# Patient Record
Sex: Male | Born: 1950 | Race: Black or African American | Hispanic: No | Marital: Married | Smoking: Former smoker
Health system: Southern US, Community
[De-identification: ages and names within clinical notes are randomized; demographics above are authoritative.]

## PROBLEM LIST (undated history)

## (undated) DIAGNOSIS — M069 Rheumatoid arthritis, unspecified: Secondary | ICD-10-CM

## (undated) DIAGNOSIS — E785 Hyperlipidemia, unspecified: Secondary | ICD-10-CM

## (undated) DIAGNOSIS — K219 Gastro-esophageal reflux disease without esophagitis: Secondary | ICD-10-CM

## (undated) DIAGNOSIS — R0602 Shortness of breath: Secondary | ICD-10-CM

## (undated) DIAGNOSIS — M1990 Unspecified osteoarthritis, unspecified site: Secondary | ICD-10-CM

## (undated) DIAGNOSIS — G4730 Sleep apnea, unspecified: Secondary | ICD-10-CM

## (undated) DIAGNOSIS — E119 Type 2 diabetes mellitus without complications: Secondary | ICD-10-CM

## (undated) DIAGNOSIS — I10 Essential (primary) hypertension: Secondary | ICD-10-CM

## (undated) DIAGNOSIS — N201 Calculus of ureter: Secondary | ICD-10-CM

## (undated) DIAGNOSIS — M2550 Pain in unspecified joint: Secondary | ICD-10-CM

## (undated) DIAGNOSIS — M549 Dorsalgia, unspecified: Secondary | ICD-10-CM

## (undated) HISTORY — DX: Rheumatoid arthritis, unspecified: M06.9

## (undated) HISTORY — DX: Pain in unspecified joint: M25.50

## (undated) HISTORY — DX: Sleep apnea, unspecified: G47.30

## (undated) HISTORY — PX: OTHER SURGICAL HISTORY: SHX169

## (undated) HISTORY — DX: Hyperlipidemia, unspecified: E78.5

## (undated) HISTORY — PX: KNEE ARTHROSCOPY: SUR90

## (undated) HISTORY — DX: Shortness of breath: R06.02

## (undated) HISTORY — DX: Dorsalgia, unspecified: M54.9

## (undated) HISTORY — PX: ANAL FISSURE REPAIR: SHX2312

---

## 2002-03-01 ENCOUNTER — Encounter: Payer: Self-pay | Admitting: General Surgery

## 2002-03-07 ENCOUNTER — Ambulatory Visit (HOSPITAL_COMMUNITY): Admission: RE | Admit: 2002-03-07 | Discharge: 2002-03-07 | Payer: Self-pay | Admitting: General Surgery

## 2002-03-07 ENCOUNTER — Encounter (INDEPENDENT_AMBULATORY_CARE_PROVIDER_SITE_OTHER): Payer: Self-pay | Admitting: Specialist

## 2002-03-16 ENCOUNTER — Emergency Department (HOSPITAL_COMMUNITY): Admission: EM | Admit: 2002-03-16 | Discharge: 2002-03-16 | Payer: Self-pay | Admitting: Emergency Medicine

## 2005-11-30 ENCOUNTER — Encounter: Admission: RE | Admit: 2005-11-30 | Discharge: 2005-11-30 | Payer: Self-pay | Admitting: Orthopedic Surgery

## 2008-04-07 ENCOUNTER — Encounter (INDEPENDENT_AMBULATORY_CARE_PROVIDER_SITE_OTHER): Payer: Self-pay | Admitting: *Deleted

## 2008-04-07 ENCOUNTER — Ambulatory Visit (HOSPITAL_COMMUNITY): Admission: RE | Admit: 2008-04-07 | Discharge: 2008-04-07 | Payer: Self-pay | Admitting: *Deleted

## 2008-05-13 ENCOUNTER — Encounter: Admission: RE | Admit: 2008-05-13 | Discharge: 2008-05-13 | Payer: Self-pay | Admitting: Internal Medicine

## 2009-05-12 IMAGING — CT CT ABDOMEN W/O CM
2 of 3 series · 13 of 32 positions shown, 18 images · non-contrast
Comparison: None

CT ABDOMEN

CLINICAL DATA: Right flank pain for 3 days.  History of urinary
tract calculi, diarrhea and constipation.

CT ABDOMEN AND PELVIS WITHOUT CONTRAST
TECHNIQUE: Multidetector CT imaging of the abdomen and pelvis was
performed following the standard protocol without intravenous
contrast.

[Series 2: renal stone w/o · axial · non-contrast · 0.98mm/px · z∈[-341,-61]mm · 5 of 85 slices shown, 10 images]
[im 15/85  soft-tissue]
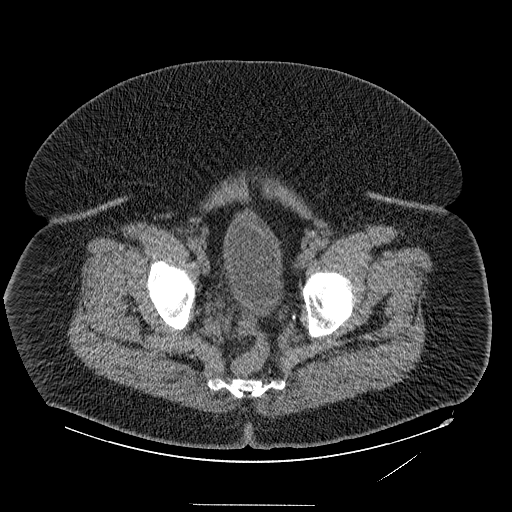
[im 15/85  bone]
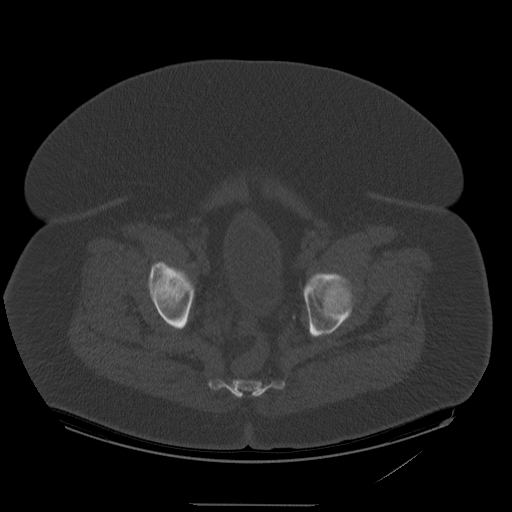
[im 29/85  soft-tissue]
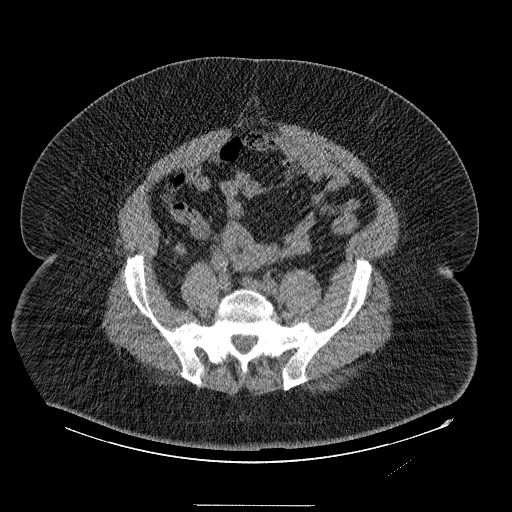
[im 29/85  lung]
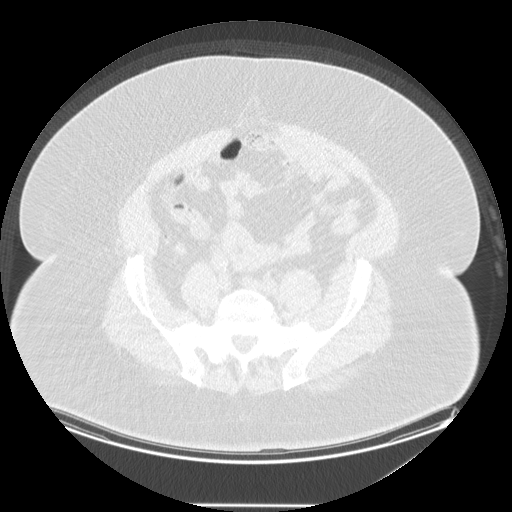
[im 43/85  soft-tissue]
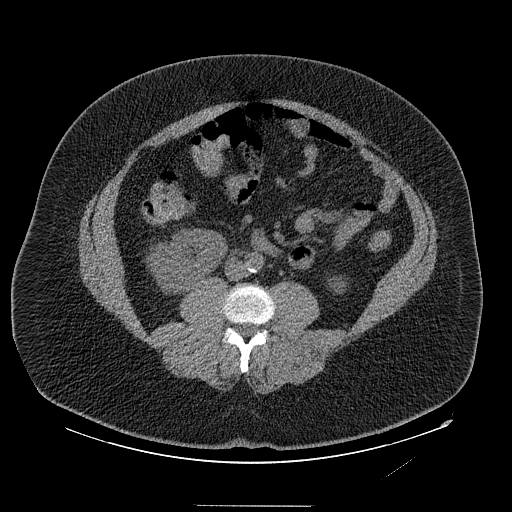
[im 43/85  lung]
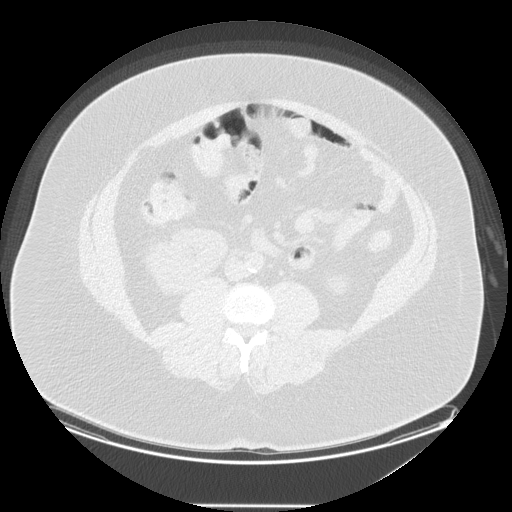
[im 57/85  soft-tissue]
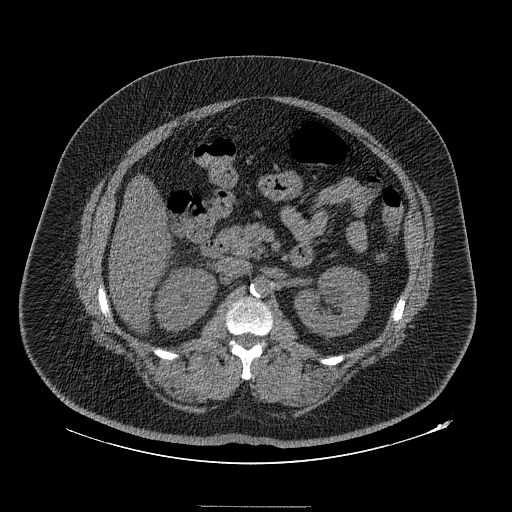
[im 57/85  lung]
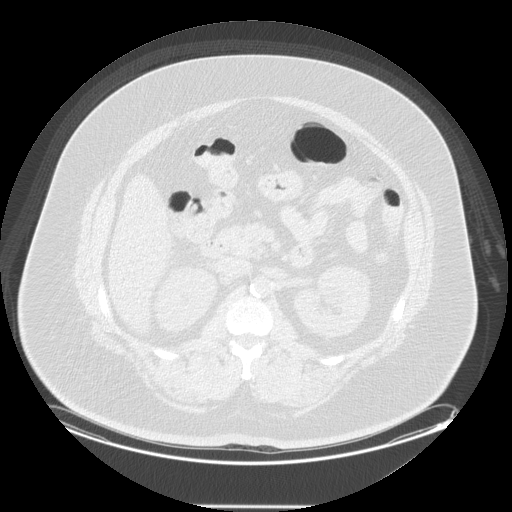
[im 71/85  soft-tissue]
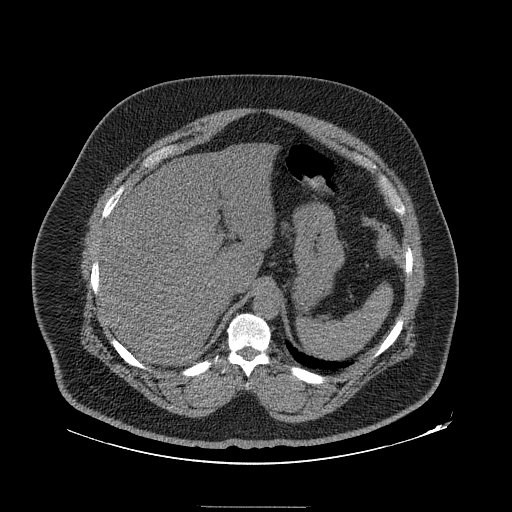
[im 71/85  lung]
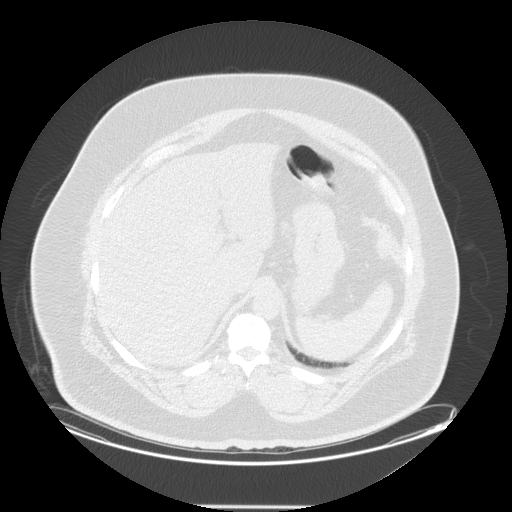

[Series 401: sagittal · sagittal · 0.98mm/px · 8 of 189 slices shown]
[im 15/189  soft-tissue]
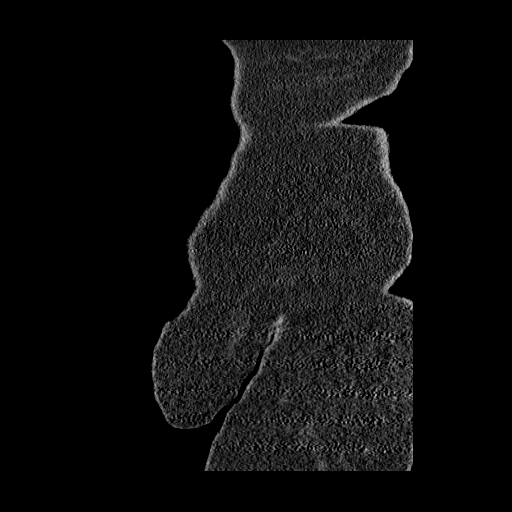
[im 44/189  soft-tissue]
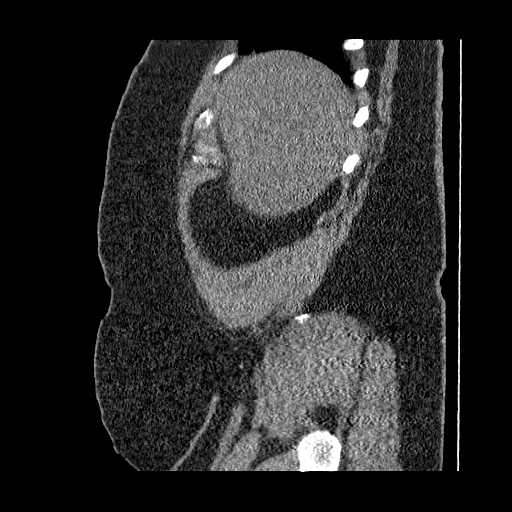
[im 58/189  soft-tissue]
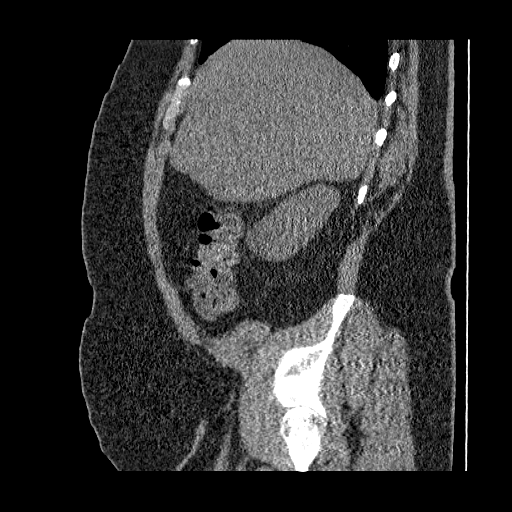
[im 87/189  soft-tissue]
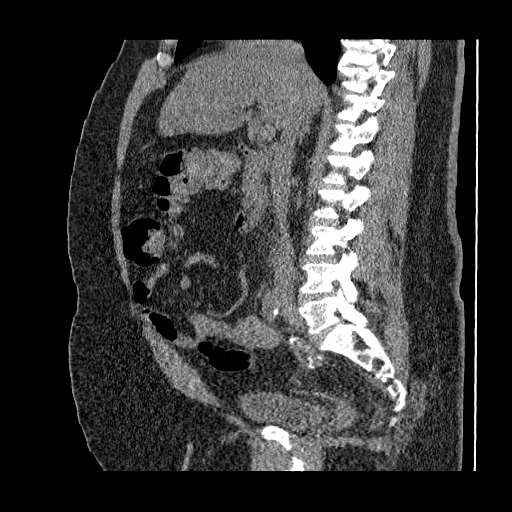
[im 102/189  soft-tissue]
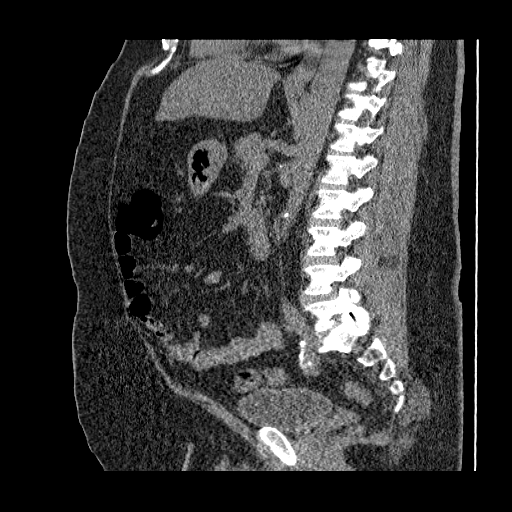
[im 131/189  soft-tissue]
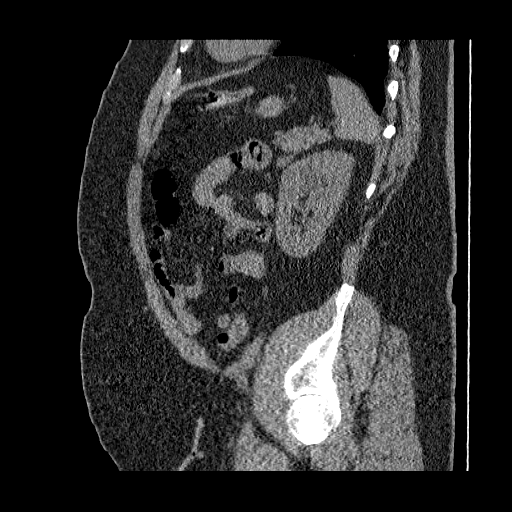
[im 145/189  soft-tissue]
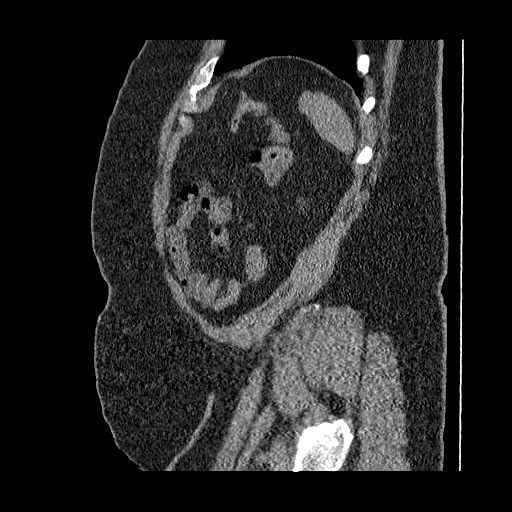
[im 174/189  soft-tissue]
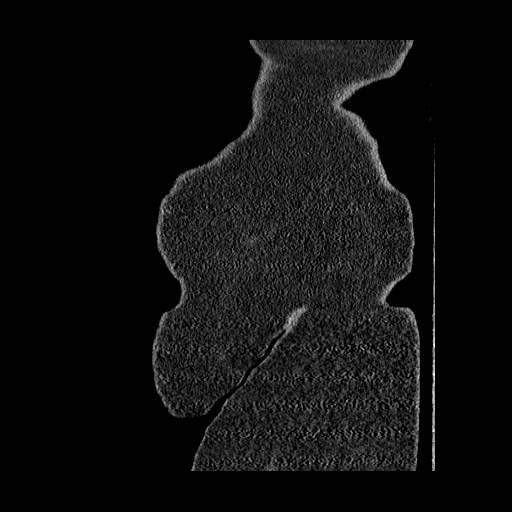

[13 of 32 positions shown; findings below may reference images not displayed]

FINDINGS: The study is limited by body habitus.  The lung bases
are clear.  There is asymmetric perinephric and periureteral soft
tissue stranding on the right associated with mild collecting
system dilatation.  No intra-abdominal ureteral or renal calculi
are demonstrated.

There are probable low density renal lesions bilaterally, including
a 3.1 x 2.8 cm lesion projecting laterally from the lower pole of
the right kidney on image 40 and a 1.7 cm lesion in the upper pole
of the left kidney on image 26. These measure near water
attenuation and are probably cysts, although are suboptimally
evaluated.

The remainder of the visualized abdomen appears unremarkable.
IMPRESSION: 1.  Distal right ureteral obstruction secondary to a calculus
described in the pelvic findings below. No other urinary tract
calculi are identified.
2.  Bilateral low-density renal lesions, likely cysts.  These are
suboptimally evaluated.

CT PELVIS
FINDINGS: The right ureter is dilated into the pelvis where there
is a small obstructing calculus at the ureteral vesicle junction.
This is most obvious on the coronally reformatted images and
measures 2-3 mm in diameter.  No other urinary tract calculi are
identified.  There are bilateral phleboliths.  No pelvic
inflammatory changes are seen.  The appendix appears normal.
IMPRESSION: Obstructing 2-3 mm calculus at the right ureteral vesicle junction.

## 2010-11-16 NOTE — Op Note (Signed)
NAMEJOVANNE, Lance Gregory NO.:  [id number]   MEDICAL RECORD NO.:  [id number]          PATIENT TYPE:  AMB   LOCATION:  ENDO                         FACILITY:  [facility]   PHYSICIAN:  Waverly Ferrari, M.D.    DATE OF BIRTH:  12/10/1950   DATE OF PROCEDURE:  04/07/2008  DATE OF DISCHARGE:                               OPERATIVE REPORT   PROCEDURE:  Colonoscopy.   INDICATIONS:  Anemia.   ANESTHESIA:  50 mcg of fentanyl, Versed 4 mg.   PROCEDURE:  With the patient mildly sedated in the left lateral  decubitus position, the Pentax videoscopic colonoscope was inserted in  the rectum after a rectal exam was attempted and passed under direct  vision to the cecum identified by ileocecal valve and appendiceal  orifice both of which were photographed.  From this point the  colonoscope was slowly withdrawn taking circumferential views of colonic  mucosa, stopping in the distal colon approximately 30 cm from the anal  verge at which point three polyps were seen, photographed and removed  using hot biopsy forceps technique, setting of 20/150 blended current.  When we had withdrawn all the way to the rectum, we placed the endoscope  in retroflexed view to view the anal canal from above, which showed  internal hemorrhoids.  The endoscope was straightened and withdrawn.  The patient's vital signs, pulse oximeter remained stable.  The patient  tolerated procedure well without apparent complication.   FINDINGS:  Small polyps of the distal colon removed.  Internal  hemorrhoids.  Await biopsy report.  The patient will call me for results  and follow-up with me as an outpatient.           ______________________________  Waverly Ferrari, M.D.     GMO/MEDQ  D:  04/07/2008  T:  04/08/2008  Job:  [id number]

## 2010-11-16 NOTE — Op Note (Signed)
NAMEBENARD, Lance NO.:  [id number]   MEDICAL RECORD NO.:  [id number]          PATIENT TYPE:  AMB   LOCATION:  ENDO                         FACILITY:  [facility]   PHYSICIAN:  Waverly Ferrari, M.D.    DATE OF BIRTH:  12-01-1950   DATE OF PROCEDURE:  04/07/2008  DATE OF DISCHARGE:                               OPERATIVE REPORT   PROCEDURE:  Upper endoscopy.   INDICATIONS:  Hemoccult positivity, iron-deficiency anemia.   ANESTHESIA:  Fentanyl 50 mcg, Versed 4 mg.   PROCEDURE:  With the patient mildly sedated in the left lateral  decubitus position, the Pentax videoscopic endoscope was inserted in the  mouth, passed under direct vision through the esophagus which appeared  normal until we reached distal esophagus and there was changes of  esophagitis with linear erythema extending cephalad.  This was  photographed and subsequently biopsied.  We entered into the stomach.  The fundus, body, antrum, duodenal bulb, and second portion duodenum  were visualized.  From this point the endoscope was slowly withdrawn  taking circumferential views of duodenal mucosa until the endoscope had  been pulled back into the stomach, placed in retroflexion to view the  stomach from below.  The endoscope was straightened and withdrawn taking  circumferential views of the remaining gastric and esophageal mucosa,  stopping in the body and fundus of the stomach where diffuse erythema  and snake skinning was noted, photographed and biopsied.  The endoscope  was withdrawn.  The patient's vital signs and pulse oximeter remained  stable.  The patient tolerated procedure well without apparent  complications.   FINDINGS:  Erythema and snake skinning of the body and fundus of the  stomach, biopsied and a linear erythema in the esophagus indicating  esophagitis biopsied.  Await biopsy report.  The patient will call me  for results and follow-up with me as an outpatient.  Proceed to  colonoscopy  as planned.           ______________________________  Waverly Ferrari, M.D.     GMO/MEDQ  D:  04/07/2008  T:  04/08/2008  Job:  [id number]
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
## 2011-04-04 LAB — GLUCOSE, CAPILLARY: Glucose-Capillary: 157 — ABNORMAL HIGH

## 2012-05-11 ENCOUNTER — Other Ambulatory Visit: Payer: Self-pay | Admitting: Urology

## 2012-05-15 ENCOUNTER — Encounter (HOSPITAL_COMMUNITY)
Admission: RE | Admit: 2012-05-15 | Discharge: 2012-05-15 | Disposition: A | Discharge status: Home / Self Care | Payer: BC Managed Care – PPO | Source: Ambulatory Visit | Attending: Urology | Admitting: Urology

## 2012-05-15 ENCOUNTER — Ambulatory Visit (HOSPITAL_COMMUNITY)
Admission: RE | Admit: 2012-05-15 | Discharge: 2012-05-15 | Disposition: A | Discharge status: Home / Self Care | Payer: BC Managed Care – PPO | Source: Ambulatory Visit | Attending: Urology | Admitting: Urology

## 2012-05-15 ENCOUNTER — Encounter (HOSPITAL_COMMUNITY): Payer: Self-pay

## 2012-05-15 ENCOUNTER — Encounter (HOSPITAL_COMMUNITY): Payer: Self-pay | Admitting: Pharmacy Technician

## 2012-05-15 DIAGNOSIS — I10 Essential (primary) hypertension: Secondary | ICD-10-CM | POA: Insufficient documentation

## 2012-05-15 HISTORY — DX: Gastro-esophageal reflux disease without esophagitis: K21.9

## 2012-05-15 HISTORY — DX: Unspecified osteoarthritis, unspecified site: M19.90

## 2012-05-15 HISTORY — DX: Essential (primary) hypertension: I10

## 2012-05-15 HISTORY — DX: Calculus of ureter: N20.1

## 2012-05-15 HISTORY — DX: Type 2 diabetes mellitus without complications: E11.9

## 2012-05-15 LAB — BASIC METABOLIC PANEL
BUN: 36 mg/dL — ABNORMAL HIGH (ref 6–23)
CO2: 23 mEq/L (ref 19–32)
Calcium: 10.1 mg/dL (ref 8.4–10.5)
Chloride: 100 mEq/L (ref 96–112)
Creatinine, Ser: 2.12 mg/dL — ABNORMAL HIGH (ref 0.50–1.35)
GFR calc Af Amer: 37 mL/min — ABNORMAL LOW (ref >90)
GFR calc non Af Amer: 32 mL/min — ABNORMAL LOW (ref >90)
Glucose, Bld: 185 mg/dL — ABNORMAL HIGH (ref 70–99)
Potassium: 4.4 mEq/L (ref 3.5–5.1)
Sodium: 136 mEq/L (ref 135–145)

## 2012-05-15 LAB — CBC
HCT: 37.1 % — ABNORMAL LOW (ref 39.0–52.0)
Hemoglobin: 12.4 g/dL — ABNORMAL LOW (ref 13.0–17.0)
MCH: 30.2 pg (ref 26.0–34.0)
MCHC: 33.4 g/dL (ref 30.0–36.0)
MCV: 90.3 fL (ref 78.0–100.0)
Platelets: 212 10*3/uL (ref 150–400)
RBC: 4.11 MIL/uL — ABNORMAL LOW (ref 4.22–5.81)
RDW: 13.3 % (ref 11.5–15.5)
WBC: 5.1 10*3/uL (ref 4.0–10.5)

## 2012-05-15 LAB — SURGICAL PCR SCREEN
MRSA, PCR: NEGATIVE
Staphylococcus aureus: NEGATIVE

## 2012-05-15 NOTE — Pre-Procedure Instructions (Signed)
CBC, BMET, EKG, CXR WERE DONE TODAY - PREOP- AT [facility] AS PER ANESTHESIOLOGIST'S GUIDELINES.

## 2012-05-15 NOTE — Patient Instructions (Signed)
YOUR SURGERY IS SCHEDULED AT [facility]  ON:  WED  11/13  AT 9:45 AM  REPORT TO [place] [facility] STAY [facility] AT:  7:45 AM      PHONE # FOR SHORT STAY IS [phone]  DO NOT EAT OR DRINK ANYTHING AFTER MIDNIGHT THE NIGHT BEFORE YOUR SURGERY.  YOU MAY BRUSH YOUR TEETH, RINSE OUT YOUR MOUTH--BUT NO WATER, NO FOOD, NO CHEWING GUM, NO MINTS, NO CANDIES, NO CHEWING TOBACCO.  PLEASE TAKE THE FOLLOWING MEDICATIONS THE AM OF YOUR SURGERY WITH A FEW SIPS OF WATER:  AMLODIPINE   IF YOU USE INHALERS--USE YOUR INHALERS THE AM OF YOUR SURGERY AND BRING INHALERS TO [place].    IF YOU ARE DIABETIC:  DO NOT TAKE ANY DIABETIC MEDICATIONS THE AM OF YOUR SURGERY.  IF YOU TAKE INSULIN IN THE EVENINGS--PLEASE ONLY TAKE 1/2 NORMAL EVENING DOSE THE NIGHT BEFORE YOUR SURGERY.  NO INSULIN THE AM OF YOUR SURGERY.  IF YOU HAVE SLEEP APNEA AND USE CPAP OR BIPAP--PLEASE BRING THE MASK AND THE TUBING.  DO NOT BRING YOUR MACHINE.  DO NOT BRING VALUABLES, MONEY, CREDIT CARDS.  DO NOT WEAR JEWELRY, MAKE-UP, NAIL POLISH AND NO METAL PINS OR CLIPS IN YOUR HAIR. CONTACT LENS, DENTURES / PARTIALS, GLASSES SHOULD NOT BE WORN TO SURGERY AND IN MOST CASES-HEARING AIDS WILL NEED TO BE REMOVED.  BRING YOUR GLASSES CASE, ANY EQUIPMENT NEEDED FOR YOUR CONTACT LENS. FOR PATIENTS ADMITTED TO THE HOSPITAL--CHECK OUT TIME THE DAY OF DISCHARGE IS 11:00 AM.  ALL INPATIENT ROOMS ARE PRIVATE - WITH BATHROOM, TELEPHONE, TELEVISION AND WIFI INTERNET.  IF YOU ARE BEING DISCHARGED THE SAME DAY OF YOUR SURGERY--YOU CAN NOT DRIVE YOURSELF HOME--AND SHOULD NOT GO HOME ALONE BY TAXI OR BUS.  NO DRIVING OR OPERATING MACHINERY FOR 24 HOURS FOLLOWING ANESTHESIA / PAIN MEDICATIONS.  PLEASE MAKE ARRANGEMENTS FOR SOMEONE TO BE WITH YOU AT HOME THE FIRST 24 HOURS AFTER SURGERY. RESPONSIBLE DRIVER'S NAME__DOUGLAS BARRETT                                                 PHONE #   [address] THAT YOU WERE GIVEN: MRSA INFORMATION

## 2012-05-15 NOTE — Pre-Procedure Instructions (Signed)
SELITA CALLED FROM DR. Delton Coombes OFFICE--HE IS NOT IN OFFICE TODAY-SHE DID SEND HIM URGENT MESSAGE TO CHECK PT'S ABNORMAL BMET--BUT HE HAS NOT RESPONDED BACK TODAY.  CHART TAKEN TO SHORT STAY CENTER.

## 2012-05-15 NOTE — Pre-Procedure Instructions (Signed)
PT'S PREOP BMET REPORT WAS FAXED TO DR. Delton Coombes OFFICE--HIS BUN 36, CREAT 2.12.  MESSAGE LEFT ON SELITA'S VOICEMAIL ( SURGERY SCHEDULER FOR DR. Jasmine December ) THAT BMET REPORT WAS FAXED-PLEASE LET ME KNOW DR. Jasmine December RECEIVED.

## 2012-05-15 NOTE — Progress Notes (Signed)
05/15/12 1304  OBSTRUCTIVE SLEEP APNEA  Have you ever been diagnosed with sleep apnea through a sleep study? No  Do you snore loudly (loud enough to be heard through closed doors)?  0  Do you often feel tired, fatigued, or sleepy during the daytime? 0  Has anyone observed you stop breathing during your sleep? 0  Do you have, or are you being treated for high blood pressure? 1  BMI more than 35 kg/m2? 1  Age over 62 years old? 1  Neck circumference greater than 40 cm/18 inches? 1  Gender: 1  Obstructive Sleep Apnea Score 5   Score 4 or greater  Results sent to PCP

## 2012-05-16 ENCOUNTER — Ambulatory Visit (HOSPITAL_COMMUNITY)
Admission: RE | Admit: 2012-05-16 | Discharge: 2012-05-16 | Disposition: A | Discharge status: Home / Self Care | Payer: BC Managed Care – PPO | Source: Ambulatory Visit | Attending: Urology | Admitting: Urology

## 2012-05-16 ENCOUNTER — Encounter (HOSPITAL_COMMUNITY)
Admission: RE | Disposition: A | Discharge status: Home / Self Care | Payer: Self-pay | Source: Ambulatory Visit | Attending: Urology

## 2012-05-16 ENCOUNTER — Ambulatory Visit (HOSPITAL_COMMUNITY): Payer: BC Managed Care – PPO | Admitting: Anesthesiology

## 2012-05-16 ENCOUNTER — Encounter (HOSPITAL_COMMUNITY): Payer: Self-pay | Admitting: Anesthesiology

## 2012-05-16 ENCOUNTER — Encounter (HOSPITAL_COMMUNITY): Payer: Self-pay | Admitting: *Deleted

## 2012-05-16 DIAGNOSIS — K219 Gastro-esophageal reflux disease without esophagitis: Secondary | ICD-10-CM | POA: Insufficient documentation

## 2012-05-16 DIAGNOSIS — E119 Type 2 diabetes mellitus without complications: Secondary | ICD-10-CM | POA: Insufficient documentation

## 2012-05-16 DIAGNOSIS — N201 Calculus of ureter: Secondary | ICD-10-CM | POA: Insufficient documentation

## 2012-05-16 DIAGNOSIS — N135 Crossing vessel and stricture of ureter without hydronephrosis: Secondary | ICD-10-CM | POA: Insufficient documentation

## 2012-05-16 DIAGNOSIS — I10 Essential (primary) hypertension: Secondary | ICD-10-CM | POA: Insufficient documentation

## 2012-05-16 DIAGNOSIS — Z79899 Other long term (current) drug therapy: Secondary | ICD-10-CM | POA: Insufficient documentation

## 2012-05-16 HISTORY — PX: HOLMIUM LASER APPLICATION: SHX5852

## 2012-05-16 HISTORY — PX: CYSTOSCOPY WITH URETEROSCOPY: SHX5123

## 2012-05-16 LAB — GLUCOSE, CAPILLARY
Glucose-Capillary: 133 mg/dL — ABNORMAL HIGH (ref 70–99)
Glucose-Capillary: 145 mg/dL — ABNORMAL HIGH (ref 70–99)

## 2012-05-16 SURGERY — HOLMIUM LASER APPLICATION
Anesthesia: General | Site: Ureter | Laterality: Left | Wound class: Clean Contaminated

## 2012-05-16 MED ORDER — PHENAZOPYRIDINE HCL 100 MG PO TABS: 100.0000 mg | ORAL_TABLET | Freq: Three times a day (TID) | ORAL | Status: DC | PRN

## 2012-05-16 MED ORDER — BELLADONNA ALKALOIDS-OPIUM 16.2-60 MG RE SUPP
RECTAL | Status: DC | PRN
  Administered 2012-05-16: 1 via RECTAL

## 2012-05-16 MED ORDER — OXYCODONE-ACETAMINOPHEN 5-325 MG PO TABS: 1.0000 | ORAL_TABLET | ORAL | Status: DC | PRN

## 2012-05-16 MED ORDER — ONDANSETRON HCL 4 MG/2ML IJ SOLN
INTRAMUSCULAR | Status: DC | PRN
  Administered 2012-05-16: 4 mg via INTRAVENOUS

## 2012-05-16 MED ORDER — SUCCINYLCHOLINE CHLORIDE 20 MG/ML IJ SOLN
INTRAMUSCULAR | Status: DC | PRN
  Administered 2012-05-16: 100 mg via INTRAVENOUS

## 2012-05-16 MED ORDER — CEFAZOLIN SODIUM-DEXTROSE 2-3 GM-% IV SOLR
INTRAVENOUS | Status: AC
  Filled 2012-05-16: qty 50

## 2012-05-16 MED ORDER — CEPHALEXIN 500 MG PO CAPS: 500.0000 mg | ORAL_CAPSULE | Freq: Three times a day (TID) | ORAL | Status: DC

## 2012-05-16 MED ORDER — LIDOCAINE HCL (CARDIAC) 20 MG/ML IV SOLN
INTRAVENOUS | Status: DC | PRN
  Administered 2012-05-16: 100 mg via INTRAVENOUS

## 2012-05-16 MED ORDER — NEOSTIGMINE METHYLSULFATE 1 MG/ML IJ SOLN
INTRAMUSCULAR | Status: DC | PRN
  Administered 2012-05-16: 5 mg via INTRAVENOUS

## 2012-05-16 MED ORDER — ROCURONIUM BROMIDE 100 MG/10ML IV SOLN
INTRAVENOUS | Status: DC | PRN
  Administered 2012-05-16: 20 mg via INTRAVENOUS
  Administered 2012-05-16: 10 mg via INTRAVENOUS

## 2012-05-16 MED ORDER — IOHEXOL 300 MG/ML  SOLN
INTRAMUSCULAR | Status: AC
  Filled 2012-05-16: qty 1

## 2012-05-16 MED ORDER — GLYCOPYRROLATE 0.2 MG/ML IJ SOLN
INTRAMUSCULAR | Status: DC | PRN
  Administered 2012-05-16: 0.6 mg via INTRAVENOUS

## 2012-05-16 MED ORDER — MIDAZOLAM HCL 5 MG/5ML IJ SOLN
INTRAMUSCULAR | Status: DC | PRN
  Administered 2012-05-16: 2 mg via INTRAVENOUS

## 2012-05-16 MED ORDER — HYOSCYAMINE SULFATE 0.125 MG PO TABS: 0.1250 mg | ORAL_TABLET | ORAL | Status: DC | PRN

## 2012-05-16 MED ORDER — FENTANYL CITRATE 0.05 MG/ML IJ SOLN
INTRAMUSCULAR | Status: DC | PRN
  Administered 2012-05-16: 100 ug via INTRAVENOUS

## 2012-05-16 MED ORDER — PROMETHAZINE HCL 25 MG/ML IJ SOLN: 6.2500 mg | INTRAMUSCULAR | Status: DC | PRN

## 2012-05-16 MED ORDER — FENTANYL CITRATE 0.05 MG/ML IJ SOLN
25.0000 ug | INTRAMUSCULAR | Status: DC | PRN
  Administered 2012-05-16: 50 ug via INTRAVENOUS

## 2012-05-16 MED ORDER — CARVEDILOL 3.125 MG PO TABS
3.1250 mg | ORAL_TABLET | Freq: Once | ORAL | Status: AC
  Administered 2012-05-16: 3.125 mg via ORAL
  Filled 2012-05-16: qty 1

## 2012-05-16 MED ORDER — CEFAZOLIN SODIUM 1-5 GM-% IV SOLN
INTRAVENOUS | Status: AC
  Filled 2012-05-16: qty 50

## 2012-05-16 MED ORDER — LIDOCAINE HCL 2 % EX GEL
CUTANEOUS | Status: AC
  Filled 2012-05-16: qty 10

## 2012-05-16 MED ORDER — LIDOCAINE HCL 2 % EX GEL
CUTANEOUS | Status: DC | PRN
  Administered 2012-05-16: 1 via URETHRAL

## 2012-05-16 MED ORDER — SENNOSIDES-DOCUSATE SODIUM 8.6-50 MG PO TABS: 1.0000 | ORAL_TABLET | Freq: Two times a day (BID) | ORAL | Status: DC

## 2012-05-16 MED ORDER — DEXAMETHASONE SODIUM PHOSPHATE 10 MG/ML IJ SOLN
INTRAMUSCULAR | Status: DC | PRN
  Administered 2012-05-16: 10 mg via INTRAVENOUS

## 2012-05-16 MED ORDER — LACTATED RINGERS IV SOLN
INTRAVENOUS | Status: DC | PRN
  Administered 2012-05-16 (×2): via INTRAVENOUS

## 2012-05-16 MED ORDER — SODIUM CHLORIDE 0.9 % IR SOLN
Status: DC | PRN
  Administered 2012-05-16: 3000 mL via INTRAVESICAL

## 2012-05-16 MED ORDER — FENTANYL CITRATE 0.05 MG/ML IJ SOLN
INTRAMUSCULAR | Status: AC
  Filled 2012-05-16: qty 2

## 2012-05-16 MED ORDER — PROPOFOL 10 MG/ML IV BOLUS
INTRAVENOUS | Status: DC | PRN
  Administered 2012-05-16: 200 mg via INTRAVENOUS

## 2012-05-16 MED ORDER — BELLADONNA ALKALOIDS-OPIUM 16.2-60 MG RE SUPP
RECTAL | Status: AC
  Filled 2012-05-16: qty 1

## 2012-05-16 MED ORDER — DEXTROSE 5 % IV SOLN
3.0000 g | INTRAVENOUS | Status: AC
  Administered 2012-05-16: 3 g via INTRAVENOUS
  Filled 2012-05-16: qty 3000

## 2012-05-16 SURGICAL SUPPLY — 41 items
ADAPTER CATH URET PLST 4-6FR (CATHETERS) IMPLANT
BAG URO CATCHER STRL LF (DRAPE) ×9 IMPLANT
BASKET LASER NITINOL 1.9FR (BASKET) IMPLANT
BASKET STNLS GEMINI 4WIRE 3FR (BASKET) IMPLANT
BASKET ZERO TIP NITINOL 2.4FR (BASKET) ×3 IMPLANT
BRUSH URET BIOPSY 3F (UROLOGICAL SUPPLIES) IMPLANT
CANISTER SUCT LVC 12 LTR MEDI- (MISCELLANEOUS) IMPLANT
CATH CLEAR GEL 3F BACKSTOP (CATHETERS) ×3 IMPLANT
CATH INTERMIT  6FR 70CM (CATHETERS) IMPLANT
CATH URET 5FR 28IN CONE TIP (BALLOONS)
CATH URET 5FR 28IN OPEN ENDED (CATHETERS) ×3 IMPLANT
CATH URET 5FR 70CM CONE TIP (BALLOONS) IMPLANT
CATH URET DUAL LUMEN 6-10FR 50 (CATHETERS) IMPLANT
CLOTH BEACON ORANGE TIMEOUT ST (SAFETY) ×3 IMPLANT
DRAPE CAMERA CLOSED 9X96 (DRAPES) ×3 IMPLANT
ELECT REM PT RETURN 9FT ADLT (ELECTROSURGICAL)
ELECTRODE REM PT RTRN 9FT ADLT (ELECTROSURGICAL) IMPLANT
GLOVE BIOGEL M 7.0 STRL (GLOVE) IMPLANT
GLOVE ECLIPSE 7.0 STRL STRAW (GLOVE) ×3 IMPLANT
GLOVE INDICATOR 7.5 STRL GRN (GLOVE) ×3 IMPLANT
GLOVE SURG SS PI 8.0 STRL IVOR (GLOVE) IMPLANT
GOWN PREVENTION PLUS LG XLONG (DISPOSABLE) IMPLANT
GOWN PREVENTION PLUS XLARGE (GOWN DISPOSABLE) ×3 IMPLANT
GOWN STRL REIN XL XLG (GOWN DISPOSABLE) ×3 IMPLANT
GUIDEWIRE 0.038 PTFE COATED (WIRE) IMPLANT
GUIDEWIRE ANG ZIPWIRE 038X150 (WIRE) IMPLANT
GUIDEWIRE STR DUAL SENSOR (WIRE) ×3 IMPLANT
IV NS IRRIG 3000ML ARTHROMATIC (IV SOLUTION) ×6 IMPLANT
KIT BALLIN UROMAX 15FX10 (LABEL) IMPLANT
KIT BALLN UROMAX 15FX4 (MISCELLANEOUS) IMPLANT
KIT BALLN UROMAX 26 75X4 (MISCELLANEOUS)
LASER FIBER DISP (UROLOGICAL SUPPLIES) ×3 IMPLANT
MANIFOLD NEPTUNE II (INSTRUMENTS) ×3 IMPLANT
MARKER SKIN DUAL TIP RULER LAB (MISCELLANEOUS) IMPLANT
PACK CYSTO (CUSTOM PROCEDURE TRAY) ×9 IMPLANT
SET HIGH PRES BAL DIL (LABEL)
SHEATH URET ACCESS 12FR/35CM (UROLOGICAL SUPPLIES) IMPLANT
SHEATH URET ACCESS 12FR/55CM (UROLOGICAL SUPPLIES) IMPLANT
STENT CONTOUR 6FRX26X.038 (STENTS) ×3 IMPLANT
SYRINGE IRR TOOMEY STRL 70CC (SYRINGE) IMPLANT
TUBING CONNECTING 10 (TUBING) ×3 IMPLANT

## 2012-05-16 NOTE — Transfer of Care (Signed)
Immediate Anesthesia Transfer of Care Note  Patient: Lance Gregory  Procedure(s) Performed: Procedure(s) (LRB) with comments: HOLMIUM LASER APPLICATION (Left) CYSTOSCOPY WITH URETEROSCOPY (Left) - left ureteral stent placement  Patient Location: PACU  Anesthesia Type:General  Level of Consciousness: sedated  Airway & Oxygen Therapy: Patient Spontanous Breathing and Patient connected to face mask oxygen  Post-op Assessment: Report given to PACU RN and Post -op Vital signs reviewed and stable  Post vital signs: Reviewed and stable  Complications: No apparent anesthesia complications

## 2012-05-16 NOTE — Op Note (Signed)
Urology Operative Report  Date of Procedure: 05/16/12  Surgeon: Rolan Bucco, MD Assistant: None  Preoperative Diagnosis: Left ureter stone. Postoperative Diagnosis: Left ureter stone. Left ureter stenosis.  Procedure(s): Cystoscopy Left ureteroscopy Laser lithotripsy Basket stone-retrieval Left ureter stent placement with tethering string Fluoroscopy with interpretation less than one hour  Estimated blood loss: None  Specimen: Stones sent to [facility] lab for chemical analysis.  Drains: None  Complications: None  Findings: Left obstructing ureter stone. Left ureter stenosis distal to the stone. No injury to the ureter or perforation.  History of present illness: 61 year old male followed for nephrolithiasis presented with left flank pain. This pain resolved, but I was concerned that he had retained stone. CT confirmed a left distal ureter stone. After reviewing treatment options he elected to proceed with cystoscopy and lithotripsy. He presented today for that procedure.   Procedure in detail: After informed consent was obtained, the patient was taken to the operating room. They were placed in the supine position. SCDs were turned on and in place. IV antibiotics were infused, and general anesthesia was induced. A timeout was performed in which the correct patient, surgical site, and procedure were identified and agreed upon by the team.  The patient was placed in a dorsolithotomy position, making sure to pad all pertinent neurovascular pressure points. The genitals were prepped and draped in the usual sterile fashion.   A rigid cystoscope was advanced through the urethra and into the bladder. The bladder was fully distended and evaluated in a systematic fashion. There were no tumors noted. Attention was turned to the left ureter orifice. It was noted to be stenotic. It was cannulated with a sensor tip wire. Initially, I had difficulty placing the wire around the stone, but with  gentle manipulation, I was able to pass the wire beyond the stone and into the left renal pelvis on fluoroscopy. Next a semirigid ureteroscope was placed into the left ureter with ease after the patient had been paralyzed. I was able to navigate this up to the obstructing stone. It was noted that he had stenosis and possible scar tissue distal to the stone in his ureter. Backstop gel was placed proximal to the stone under fluoroscopy to prevent migration of fragments into the kidney.  Lithotripsy was carried out with a holmium laser and 200  filament. Lithotripsy was carried out at 0.5 J and 20 Hz. After all the fragments were broken down small and removed, they were grasped with a 0 tip Nitinol basket and placed into the bladder. After this, I navigated the ureteroscope beyond the location of the backstop gel and noted no further fragments. I then removed the ureteroscope and evaluated the entire ureter distal to the stone and there was noted to be no perforation or injury other than the previous mentioned scar tissue that was present before starting surgery.  The safety wire was loaded through the rigid cystoscope and a 6 x 26 double-J stent with tethering string someplace was placed up over the wire with ease. His was deployed in the left renal pelvis with a good curl noted on fluoroscopy and a curl noted in the bladder on direct visualization. Urethra lidocaine jelly, 10 cc, were placed into the urethra and a belladonna and opium suppository was placed into the rectum.  This completed the procedure. Anesthesia was reversed and he was put back into a supine position. He was taken to the PACU in stable condition.  He will be given antibiotics to cover for the time  of his ureter stent is in place and he will remove the stent on his own on Monday, 05/21/12.

## 2012-05-16 NOTE — H&P (Signed)
Urology History and Physical Exam  CC: Left ureter stone  HPI: 61 year old male presents today with left ureter stone. This was associated with left-sided flank pain. This is not associated with gross hematuria or dysuria. It is not associated with fevers. KUB 04/26/12 revealed what appeared to be a left distal ureter stone which was 4 mm in size. Renal ultrasound from that same date revealed mild left hydronephrosis. The patient was unsure whether he passed the stone and he was not having any further symptoms. He went on to have a CT scan 05/10/12 to confirm the presence of the stone before proceeding with any type of intervention. This revealed a left distal ureter stone. It was at the level of the iliac bone. It was 8 mm in size. It was associated with mild hydronephrosis. We discussed treatment options including observation, shockwave lithotripsy, and ureteroscopy. He presents today for cystoscopy, left ureteroscopy, laser lithotripsy, possible left ureter stent placement. We have discussed the risks, benefits, alternatives, and likelihood of achieving goals. UA 05/10/12 was negative for signs of infection.   PMH: Past Medical History  Diagnosis Date  . Diabetes mellitus without complication     ORAL MEDS-NO INSULIN  . Hypertension   . Ureteral stone     LEFT  --DISCOMFORT  . GERD (gastroesophageal reflux disease)     OCCAS AFTER EATING CERTAIN FOODS  . Arthritis     PSH: Past Surgical History  Procedure Date  . Knee arthroscopy     SEVERAL  SURGERIES BOTH KNEES  . Vascectomy   . Anal fissure repair     Allergies: Allergies  Allergen Reactions  . Codeine     REACTION WAS YEARS AGO-PT DOES NOT REMEMBER WHAT THE REACTION WAS    Medications: No prescriptions prior to admission     Social History: History   Social History  . Marital Status: Married    Spouse Name: N/A    Number of Children: N/A  . Years of Education: N/A   Occupational History  . Not on file.    Social History Main Topics  . Smoking status: Former Research scientist (life sciences)  . Smokeless tobacco: Never Used     Comment: QUIT SMOKING MANY YRS AGO  . Alcohol Use: Yes     Comment: OCCAS  . Drug Use: No  . Sexually Active:    Other Topics Concern  . Not on file   Social History Narrative  . No narrative on file    Family History: No family history on file.  Review of Systems: Positive: Left flank discomfort. Negative: Fever, chest pain, or SOB.  A further 10 point review of systems was negative except what is listed in the HPI.  Physical Exam: Filed Vitals:   05/16/12 0824  BP: 131/76  Pulse: 70  Temp:   Resp:     General: No acute distress.  Awake. Head:  Normocephalic.  Atraumatic. ENT:  EOMI.  Mucous membranes moist Neck:  Supple.  No lymphadenopathy. Pulmonary: Equal effort bilaterally.  Clear to auscultation bilaterally. Abdomen: Soft.  Non- tender to palpation. Skin:  Normal turgor.  No visible rash. Extremity: No gross deformity of bilateral upper extremities.  No gross deformity of    bilateral lower extremities. Neurologic: Alert. Appropriate mood.    Studies:  Recent Labs  [facility] 05/15/12 1115   HGB 12.4*   WBC 5.1   PLT 212    Recent Labs  [facility] 05/15/12 1115   NA 136   K 4.4   CL  100   CO2 23   BUN 36*   CREATININE 2.12*   CALCIUM 10.1   GFRNONAA 32*   GFRAA 37*     No results found for this basename: PT:2,INR:2,APTT:2 in the last 72 hours   No components found with this basename: ABG:2    Assessment:  Left ureter stone.  Plan: To OR for cystoscopy, left ureteroscopy, laser lithotripsy, possible left ureter stent placement.

## 2012-05-16 NOTE — Anesthesia Preprocedure Evaluation (Addendum)
Anesthesia Evaluation  Patient identified by MRN, date of birth, ID band Patient awake    Reviewed: Allergy & Precautions, H&P , NPO status , Patient's Chart, lab work & pertinent test results  Airway Mallampati: III TM Distance: <3 FB Neck ROM: Full    Dental No notable dental hx. (+) Dental Advisory Given   Pulmonary neg pulmonary ROS,  breath sounds clear to auscultation  + decreased breath sounds      Cardiovascular hypertension, Pt. on medications and Pt. on home beta blockers Rhythm:Regular Rate:Normal     Neuro/Psych negative neurological ROS  negative psych ROS   GI/Hepatic Neg liver ROS, GERD-  ,  Endo/Other  diabetes, Type 2, Oral Hypoglycemic AgentsMorbid obesity  Renal/GU negative Renal ROS  negative genitourinary   Musculoskeletal negative musculoskeletal ROS (+)   Abdominal   Peds  Hematology negative hematology ROS (+)   Anesthesia Other Findings   Reproductive/Obstetrics negative OB ROS                          Anesthesia Physical Anesthesia Plan  ASA: III  Anesthesia Plan: General   Post-op Pain Management:    Induction: Intravenous  Airway Management Planned: LMA  Additional Equipment:   Intra-op Plan:   Post-operative Plan:   Informed Consent: I have reviewed the patients History and Physical, chart, labs and discussed the procedure including the risks, benefits and alternatives for the proposed anesthesia with the patient or authorized representative who has indicated his/her understanding and acceptance.   Dental advisory given  Plan Discussed with: CRNA and Surgeon  Anesthesia Plan Comments:         Anesthesia Quick Evaluation

## 2012-05-16 NOTE — Progress Notes (Signed)
Stent string taped to penis

## 2012-05-16 NOTE — Anesthesia Postprocedure Evaluation (Signed)
  Anesthesia Post-op Note  Patient: Lance Gregory  Procedure(s) Performed: Procedure(s) (LRB): HOLMIUM LASER APPLICATION (Left) CYSTOSCOPY WITH URETEROSCOPY (Left)  Patient Location: PACU  Anesthesia Type: General  Level of Consciousness: awake and alert   Airway and Oxygen Therapy: Patient Spontanous Breathing  Post-op Pain: mild  Post-op Assessment: Post-op Vital signs reviewed, Patient's Cardiovascular Status Stable, Respiratory Function Stable, Patent Airway and No signs of Nausea or vomiting  Post-op Vital Signs: stable  Complications: No apparent anesthesia complications

## 2012-05-17 ENCOUNTER — Encounter (HOSPITAL_COMMUNITY): Payer: Self-pay | Admitting: Urology

## 2014-07-29 DIAGNOSIS — I10 Essential (primary) hypertension: Secondary | ICD-10-CM | POA: Diagnosis not present

## 2014-07-29 DIAGNOSIS — N179 Acute kidney failure, unspecified: Secondary | ICD-10-CM | POA: Diagnosis not present

## 2014-07-29 DIAGNOSIS — N183 Chronic kidney disease, stage 3 (moderate): Secondary | ICD-10-CM | POA: Diagnosis not present

## 2014-07-29 DIAGNOSIS — R809 Proteinuria, unspecified: Secondary | ICD-10-CM | POA: Diagnosis not present

## 2014-10-03 DIAGNOSIS — E119 Type 2 diabetes mellitus without complications: Secondary | ICD-10-CM | POA: Diagnosis not present

## 2014-10-03 DIAGNOSIS — I10 Essential (primary) hypertension: Secondary | ICD-10-CM | POA: Diagnosis not present

## 2014-10-03 DIAGNOSIS — E559 Vitamin D deficiency, unspecified: Secondary | ICD-10-CM | POA: Diagnosis not present

## 2014-10-07 DIAGNOSIS — I10 Essential (primary) hypertension: Secondary | ICD-10-CM | POA: Diagnosis not present

## 2014-10-07 DIAGNOSIS — E780 Pure hypercholesterolemia: Secondary | ICD-10-CM | POA: Diagnosis not present

## 2014-10-07 DIAGNOSIS — E119 Type 2 diabetes mellitus without complications: Secondary | ICD-10-CM | POA: Diagnosis not present

## 2014-10-07 DIAGNOSIS — E559 Vitamin D deficiency, unspecified: Secondary | ICD-10-CM | POA: Diagnosis not present

## 2015-01-01 DIAGNOSIS — E559 Vitamin D deficiency, unspecified: Secondary | ICD-10-CM | POA: Diagnosis not present

## 2015-01-01 DIAGNOSIS — E119 Type 2 diabetes mellitus without complications: Secondary | ICD-10-CM | POA: Diagnosis not present

## 2015-01-01 DIAGNOSIS — I10 Essential (primary) hypertension: Secondary | ICD-10-CM | POA: Diagnosis not present

## 2015-01-08 DIAGNOSIS — E780 Pure hypercholesterolemia: Secondary | ICD-10-CM | POA: Diagnosis not present

## 2015-01-08 DIAGNOSIS — E559 Vitamin D deficiency, unspecified: Secondary | ICD-10-CM | POA: Diagnosis not present

## 2015-01-08 DIAGNOSIS — I10 Essential (primary) hypertension: Secondary | ICD-10-CM | POA: Diagnosis not present

## 2015-01-08 DIAGNOSIS — E119 Type 2 diabetes mellitus without complications: Secondary | ICD-10-CM | POA: Diagnosis not present

## 2015-03-27 DIAGNOSIS — Z23 Encounter for immunization: Secondary | ICD-10-CM | POA: Diagnosis not present

## 2015-04-09 DIAGNOSIS — I10 Essential (primary) hypertension: Secondary | ICD-10-CM | POA: Diagnosis not present

## 2015-04-09 DIAGNOSIS — E119 Type 2 diabetes mellitus without complications: Secondary | ICD-10-CM | POA: Diagnosis not present

## 2015-04-16 DIAGNOSIS — E119 Type 2 diabetes mellitus without complications: Secondary | ICD-10-CM | POA: Diagnosis not present

## 2015-04-16 DIAGNOSIS — E7800 Pure hypercholesterolemia, unspecified: Secondary | ICD-10-CM | POA: Diagnosis not present

## 2015-04-16 DIAGNOSIS — I10 Essential (primary) hypertension: Secondary | ICD-10-CM | POA: Diagnosis not present

## 2015-04-16 DIAGNOSIS — E559 Vitamin D deficiency, unspecified: Secondary | ICD-10-CM | POA: Diagnosis not present

## 2015-04-30 DIAGNOSIS — I10 Essential (primary) hypertension: Secondary | ICD-10-CM | POA: Diagnosis not present

## 2015-04-30 DIAGNOSIS — R809 Proteinuria, unspecified: Secondary | ICD-10-CM | POA: Diagnosis not present

## 2015-04-30 DIAGNOSIS — N179 Acute kidney failure, unspecified: Secondary | ICD-10-CM | POA: Diagnosis not present

## 2015-04-30 DIAGNOSIS — N183 Chronic kidney disease, stage 3 (moderate): Secondary | ICD-10-CM | POA: Diagnosis not present

## 2015-08-13 DIAGNOSIS — E559 Vitamin D deficiency, unspecified: Secondary | ICD-10-CM | POA: Diagnosis not present

## 2015-08-13 DIAGNOSIS — I10 Essential (primary) hypertension: Secondary | ICD-10-CM | POA: Diagnosis not present

## 2015-08-13 DIAGNOSIS — E119 Type 2 diabetes mellitus without complications: Secondary | ICD-10-CM | POA: Diagnosis not present

## 2015-11-19 DIAGNOSIS — H40013 Open angle with borderline findings, low risk, bilateral: Secondary | ICD-10-CM | POA: Diagnosis not present

## 2015-11-19 DIAGNOSIS — Z7984 Long term (current) use of oral hypoglycemic drugs: Secondary | ICD-10-CM | POA: Diagnosis not present

## 2015-11-19 DIAGNOSIS — H2513 Age-related nuclear cataract, bilateral: Secondary | ICD-10-CM | POA: Diagnosis not present

## 2015-11-19 DIAGNOSIS — H5203 Hypermetropia, bilateral: Secondary | ICD-10-CM | POA: Diagnosis not present

## 2015-11-19 DIAGNOSIS — H52223 Regular astigmatism, bilateral: Secondary | ICD-10-CM | POA: Diagnosis not present

## 2015-11-19 DIAGNOSIS — E119 Type 2 diabetes mellitus without complications: Secondary | ICD-10-CM | POA: Diagnosis not present

## 2015-11-19 DIAGNOSIS — H524 Presbyopia: Secondary | ICD-10-CM | POA: Diagnosis not present

## 2015-12-02 DIAGNOSIS — I10 Essential (primary) hypertension: Secondary | ICD-10-CM | POA: Diagnosis not present

## 2015-12-02 DIAGNOSIS — E119 Type 2 diabetes mellitus without complications: Secondary | ICD-10-CM | POA: Diagnosis not present

## 2015-12-02 DIAGNOSIS — E7800 Pure hypercholesterolemia, unspecified: Secondary | ICD-10-CM | POA: Diagnosis not present

## 2015-12-03 DIAGNOSIS — N179 Acute kidney failure, unspecified: Secondary | ICD-10-CM | POA: Diagnosis not present

## 2015-12-03 DIAGNOSIS — R809 Proteinuria, unspecified: Secondary | ICD-10-CM | POA: Diagnosis not present

## 2015-12-03 DIAGNOSIS — I10 Essential (primary) hypertension: Secondary | ICD-10-CM | POA: Diagnosis not present

## 2015-12-03 DIAGNOSIS — N183 Chronic kidney disease, stage 3 (moderate): Secondary | ICD-10-CM | POA: Diagnosis not present

## 2015-12-03 DIAGNOSIS — Z87442 Personal history of urinary calculi: Secondary | ICD-10-CM | POA: Diagnosis not present

## 2015-12-03 DIAGNOSIS — R600 Localized edema: Secondary | ICD-10-CM | POA: Diagnosis not present

## 2015-12-23 DIAGNOSIS — E119 Type 2 diabetes mellitus without complications: Secondary | ICD-10-CM | POA: Diagnosis not present

## 2015-12-23 DIAGNOSIS — I10 Essential (primary) hypertension: Secondary | ICD-10-CM | POA: Diagnosis not present

## 2015-12-30 DIAGNOSIS — E7800 Pure hypercholesterolemia, unspecified: Secondary | ICD-10-CM | POA: Diagnosis not present

## 2015-12-30 DIAGNOSIS — E119 Type 2 diabetes mellitus without complications: Secondary | ICD-10-CM | POA: Diagnosis not present

## 2015-12-30 DIAGNOSIS — I10 Essential (primary) hypertension: Secondary | ICD-10-CM | POA: Diagnosis not present

## 2015-12-30 DIAGNOSIS — E559 Vitamin D deficiency, unspecified: Secondary | ICD-10-CM | POA: Diagnosis not present

## 2016-01-25 DIAGNOSIS — N183 Chronic kidney disease, stage 3 (moderate): Secondary | ICD-10-CM | POA: Diagnosis not present

## 2016-01-28 DIAGNOSIS — R05 Cough: Secondary | ICD-10-CM | POA: Diagnosis not present

## 2016-01-28 DIAGNOSIS — I10 Essential (primary) hypertension: Secondary | ICD-10-CM | POA: Diagnosis not present

## 2016-01-28 DIAGNOSIS — E7800 Pure hypercholesterolemia, unspecified: Secondary | ICD-10-CM | POA: Diagnosis not present

## 2016-01-28 DIAGNOSIS — E119 Type 2 diabetes mellitus without complications: Secondary | ICD-10-CM | POA: Diagnosis not present

## 2016-04-04 DIAGNOSIS — R600 Localized edema: Secondary | ICD-10-CM | POA: Diagnosis not present

## 2016-04-04 DIAGNOSIS — R809 Proteinuria, unspecified: Secondary | ICD-10-CM | POA: Diagnosis not present

## 2016-04-04 DIAGNOSIS — N183 Chronic kidney disease, stage 3 (moderate): Secondary | ICD-10-CM | POA: Diagnosis not present

## 2016-04-04 DIAGNOSIS — N179 Acute kidney failure, unspecified: Secondary | ICD-10-CM | POA: Diagnosis not present

## 2016-04-04 DIAGNOSIS — Z6841 Body Mass Index (BMI) 40.0 and over, adult: Secondary | ICD-10-CM | POA: Diagnosis not present

## 2016-04-04 DIAGNOSIS — Z87442 Personal history of urinary calculi: Secondary | ICD-10-CM | POA: Diagnosis not present

## 2016-04-04 DIAGNOSIS — Z23 Encounter for immunization: Secondary | ICD-10-CM | POA: Diagnosis not present

## 2016-04-04 DIAGNOSIS — I10 Essential (primary) hypertension: Secondary | ICD-10-CM | POA: Diagnosis not present

## 2016-05-24 DIAGNOSIS — E119 Type 2 diabetes mellitus without complications: Secondary | ICD-10-CM | POA: Diagnosis not present

## 2016-05-24 DIAGNOSIS — I10 Essential (primary) hypertension: Secondary | ICD-10-CM | POA: Diagnosis not present

## 2016-05-30 DIAGNOSIS — M25561 Pain in right knee: Secondary | ICD-10-CM | POA: Diagnosis not present

## 2016-05-30 DIAGNOSIS — E119 Type 2 diabetes mellitus without complications: Secondary | ICD-10-CM | POA: Diagnosis not present

## 2016-05-30 DIAGNOSIS — M25562 Pain in left knee: Secondary | ICD-10-CM | POA: Diagnosis not present

## 2016-05-30 DIAGNOSIS — G8929 Other chronic pain: Secondary | ICD-10-CM | POA: Diagnosis not present

## 2016-06-07 DIAGNOSIS — M79672 Pain in left foot: Secondary | ICD-10-CM | POA: Diagnosis not present

## 2016-06-07 DIAGNOSIS — M25569 Pain in unspecified knee: Secondary | ICD-10-CM | POA: Diagnosis not present

## 2016-06-07 DIAGNOSIS — M79643 Pain in unspecified hand: Secondary | ICD-10-CM | POA: Diagnosis not present

## 2016-06-07 DIAGNOSIS — M109 Gout, unspecified: Secondary | ICD-10-CM | POA: Diagnosis not present

## 2016-06-21 DIAGNOSIS — M79672 Pain in left foot: Secondary | ICD-10-CM | POA: Diagnosis not present

## 2016-06-21 DIAGNOSIS — M179 Osteoarthritis of knee, unspecified: Secondary | ICD-10-CM | POA: Diagnosis not present

## 2016-06-21 DIAGNOSIS — M19071 Primary osteoarthritis, right ankle and foot: Secondary | ICD-10-CM | POA: Diagnosis not present

## 2016-06-21 DIAGNOSIS — M549 Dorsalgia, unspecified: Secondary | ICD-10-CM | POA: Diagnosis not present

## 2016-06-21 DIAGNOSIS — M19072 Primary osteoarthritis, left ankle and foot: Secondary | ICD-10-CM | POA: Diagnosis not present

## 2016-06-21 DIAGNOSIS — M19042 Primary osteoarthritis, left hand: Secondary | ICD-10-CM | POA: Diagnosis not present

## 2016-06-21 DIAGNOSIS — M150 Primary generalized (osteo)arthritis: Secondary | ICD-10-CM | POA: Diagnosis not present

## 2016-06-21 DIAGNOSIS — M79641 Pain in right hand: Secondary | ICD-10-CM | POA: Diagnosis not present

## 2016-06-21 DIAGNOSIS — M545 Low back pain: Secondary | ICD-10-CM | POA: Diagnosis not present

## 2016-06-21 DIAGNOSIS — M19041 Primary osteoarthritis, right hand: Secondary | ICD-10-CM | POA: Diagnosis not present

## 2016-06-21 DIAGNOSIS — M79643 Pain in unspecified hand: Secondary | ICD-10-CM | POA: Diagnosis not present

## 2016-06-21 DIAGNOSIS — M79642 Pain in left hand: Secondary | ICD-10-CM | POA: Diagnosis not present

## 2016-06-21 DIAGNOSIS — M79673 Pain in unspecified foot: Secondary | ICD-10-CM | POA: Diagnosis not present

## 2016-06-21 DIAGNOSIS — M109 Gout, unspecified: Secondary | ICD-10-CM | POA: Diagnosis not present

## 2016-06-21 DIAGNOSIS — M25569 Pain in unspecified knee: Secondary | ICD-10-CM | POA: Diagnosis not present

## 2016-07-14 DIAGNOSIS — M25562 Pain in left knee: Secondary | ICD-10-CM | POA: Diagnosis not present

## 2016-07-14 DIAGNOSIS — M109 Gout, unspecified: Secondary | ICD-10-CM | POA: Diagnosis not present

## 2016-07-14 DIAGNOSIS — M150 Primary generalized (osteo)arthritis: Secondary | ICD-10-CM | POA: Diagnosis not present

## 2016-07-14 DIAGNOSIS — M549 Dorsalgia, unspecified: Secondary | ICD-10-CM | POA: Diagnosis not present

## 2016-07-14 DIAGNOSIS — M25569 Pain in unspecified knee: Secondary | ICD-10-CM | POA: Diagnosis not present

## 2016-08-04 DIAGNOSIS — M25569 Pain in unspecified knee: Secondary | ICD-10-CM | POA: Diagnosis not present

## 2016-08-04 DIAGNOSIS — M170 Bilateral primary osteoarthritis of knee: Secondary | ICD-10-CM | POA: Diagnosis not present

## 2016-08-04 DIAGNOSIS — N183 Chronic kidney disease, stage 3 (moderate): Secondary | ICD-10-CM | POA: Diagnosis not present

## 2016-08-04 DIAGNOSIS — M109 Gout, unspecified: Secondary | ICD-10-CM | POA: Diagnosis not present

## 2016-08-04 DIAGNOSIS — M150 Primary generalized (osteo)arthritis: Secondary | ICD-10-CM | POA: Diagnosis not present

## 2016-08-23 DIAGNOSIS — I10 Essential (primary) hypertension: Secondary | ICD-10-CM | POA: Diagnosis not present

## 2016-08-23 DIAGNOSIS — E119 Type 2 diabetes mellitus without complications: Secondary | ICD-10-CM | POA: Diagnosis not present

## 2016-08-30 DIAGNOSIS — I10 Essential (primary) hypertension: Secondary | ICD-10-CM | POA: Diagnosis not present

## 2016-08-30 DIAGNOSIS — E119 Type 2 diabetes mellitus without complications: Secondary | ICD-10-CM | POA: Diagnosis not present

## 2016-08-30 DIAGNOSIS — E7800 Pure hypercholesterolemia, unspecified: Secondary | ICD-10-CM | POA: Diagnosis not present

## 2016-08-30 DIAGNOSIS — E559 Vitamin D deficiency, unspecified: Secondary | ICD-10-CM | POA: Diagnosis not present

## 2016-09-01 DIAGNOSIS — Z23 Encounter for immunization: Secondary | ICD-10-CM | POA: Diagnosis not present

## 2016-09-01 DIAGNOSIS — M170 Bilateral primary osteoarthritis of knee: Secondary | ICD-10-CM | POA: Diagnosis not present

## 2016-09-01 DIAGNOSIS — M109 Gout, unspecified: Secondary | ICD-10-CM | POA: Diagnosis not present

## 2016-09-01 DIAGNOSIS — N183 Chronic kidney disease, stage 3 (moderate): Secondary | ICD-10-CM | POA: Diagnosis not present

## 2016-09-01 DIAGNOSIS — M150 Primary generalized (osteo)arthritis: Secondary | ICD-10-CM | POA: Diagnosis not present

## 2016-10-31 DIAGNOSIS — Z87442 Personal history of urinary calculi: Secondary | ICD-10-CM | POA: Diagnosis not present

## 2016-10-31 DIAGNOSIS — R809 Proteinuria, unspecified: Secondary | ICD-10-CM | POA: Diagnosis not present

## 2016-10-31 DIAGNOSIS — I10 Essential (primary) hypertension: Secondary | ICD-10-CM | POA: Diagnosis not present

## 2016-10-31 DIAGNOSIS — N179 Acute kidney failure, unspecified: Secondary | ICD-10-CM | POA: Diagnosis not present

## 2016-10-31 DIAGNOSIS — R600 Localized edema: Secondary | ICD-10-CM | POA: Diagnosis not present

## 2016-10-31 DIAGNOSIS — Z6841 Body Mass Index (BMI) 40.0 and over, adult: Secondary | ICD-10-CM | POA: Diagnosis not present

## 2016-10-31 DIAGNOSIS — N183 Chronic kidney disease, stage 3 (moderate): Secondary | ICD-10-CM | POA: Diagnosis not present

## 2016-11-24 DIAGNOSIS — E119 Type 2 diabetes mellitus without complications: Secondary | ICD-10-CM | POA: Diagnosis not present

## 2016-11-24 DIAGNOSIS — I10 Essential (primary) hypertension: Secondary | ICD-10-CM | POA: Diagnosis not present

## 2016-11-24 DIAGNOSIS — E559 Vitamin D deficiency, unspecified: Secondary | ICD-10-CM | POA: Diagnosis not present

## 2016-12-01 DIAGNOSIS — E7800 Pure hypercholesterolemia, unspecified: Secondary | ICD-10-CM | POA: Diagnosis not present

## 2016-12-01 DIAGNOSIS — I10 Essential (primary) hypertension: Secondary | ICD-10-CM | POA: Diagnosis not present

## 2016-12-01 DIAGNOSIS — E119 Type 2 diabetes mellitus without complications: Secondary | ICD-10-CM | POA: Diagnosis not present

## 2016-12-01 DIAGNOSIS — Z125 Encounter for screening for malignant neoplasm of prostate: Secondary | ICD-10-CM | POA: Diagnosis not present

## 2016-12-27 DIAGNOSIS — Z7984 Long term (current) use of oral hypoglycemic drugs: Secondary | ICD-10-CM | POA: Diagnosis not present

## 2016-12-27 DIAGNOSIS — E119 Type 2 diabetes mellitus without complications: Secondary | ICD-10-CM | POA: Diagnosis not present

## 2016-12-27 DIAGNOSIS — H2513 Age-related nuclear cataract, bilateral: Secondary | ICD-10-CM | POA: Diagnosis not present

## 2016-12-27 DIAGNOSIS — H524 Presbyopia: Secondary | ICD-10-CM | POA: Diagnosis not present

## 2016-12-27 DIAGNOSIS — H52222 Regular astigmatism, left eye: Secondary | ICD-10-CM | POA: Diagnosis not present

## 2016-12-27 DIAGNOSIS — H5203 Hypermetropia, bilateral: Secondary | ICD-10-CM | POA: Diagnosis not present

## 2016-12-27 DIAGNOSIS — H40013 Open angle with borderline findings, low risk, bilateral: Secondary | ICD-10-CM | POA: Diagnosis not present

## 2017-04-11 DIAGNOSIS — M25562 Pain in left knee: Secondary | ICD-10-CM | POA: Diagnosis not present

## 2017-04-11 DIAGNOSIS — M9904 Segmental and somatic dysfunction of sacral region: Secondary | ICD-10-CM | POA: Diagnosis not present

## 2017-04-11 DIAGNOSIS — M9903 Segmental and somatic dysfunction of lumbar region: Secondary | ICD-10-CM | POA: Diagnosis not present

## 2017-04-11 DIAGNOSIS — M25552 Pain in left hip: Secondary | ICD-10-CM | POA: Diagnosis not present

## 2017-04-11 DIAGNOSIS — M25561 Pain in right knee: Secondary | ICD-10-CM | POA: Diagnosis not present

## 2017-04-11 DIAGNOSIS — M25551 Pain in right hip: Secondary | ICD-10-CM | POA: Diagnosis not present

## 2017-04-11 DIAGNOSIS — M9905 Segmental and somatic dysfunction of pelvic region: Secondary | ICD-10-CM | POA: Diagnosis not present

## 2017-04-11 DIAGNOSIS — M170 Bilateral primary osteoarthritis of knee: Secondary | ICD-10-CM | POA: Diagnosis not present

## 2017-04-11 DIAGNOSIS — M5136 Other intervertebral disc degeneration, lumbar region: Secondary | ICD-10-CM | POA: Diagnosis not present

## 2017-04-13 DIAGNOSIS — M25562 Pain in left knee: Secondary | ICD-10-CM | POA: Diagnosis not present

## 2017-04-13 DIAGNOSIS — M170 Bilateral primary osteoarthritis of knee: Secondary | ICD-10-CM | POA: Diagnosis not present

## 2017-04-13 DIAGNOSIS — M9905 Segmental and somatic dysfunction of pelvic region: Secondary | ICD-10-CM | POA: Diagnosis not present

## 2017-04-13 DIAGNOSIS — M9904 Segmental and somatic dysfunction of sacral region: Secondary | ICD-10-CM | POA: Diagnosis not present

## 2017-04-13 DIAGNOSIS — M9903 Segmental and somatic dysfunction of lumbar region: Secondary | ICD-10-CM | POA: Diagnosis not present

## 2017-04-13 DIAGNOSIS — M25561 Pain in right knee: Secondary | ICD-10-CM | POA: Diagnosis not present

## 2017-04-13 DIAGNOSIS — M25552 Pain in left hip: Secondary | ICD-10-CM | POA: Diagnosis not present

## 2017-04-13 DIAGNOSIS — M5136 Other intervertebral disc degeneration, lumbar region: Secondary | ICD-10-CM | POA: Diagnosis not present

## 2017-04-13 DIAGNOSIS — M25551 Pain in right hip: Secondary | ICD-10-CM | POA: Diagnosis not present

## 2017-04-17 DIAGNOSIS — M9905 Segmental and somatic dysfunction of pelvic region: Secondary | ICD-10-CM | POA: Diagnosis not present

## 2017-04-17 DIAGNOSIS — M25561 Pain in right knee: Secondary | ICD-10-CM | POA: Diagnosis not present

## 2017-04-17 DIAGNOSIS — M5136 Other intervertebral disc degeneration, lumbar region: Secondary | ICD-10-CM | POA: Diagnosis not present

## 2017-04-17 DIAGNOSIS — M25552 Pain in left hip: Secondary | ICD-10-CM | POA: Diagnosis not present

## 2017-04-17 DIAGNOSIS — M25562 Pain in left knee: Secondary | ICD-10-CM | POA: Diagnosis not present

## 2017-04-17 DIAGNOSIS — M170 Bilateral primary osteoarthritis of knee: Secondary | ICD-10-CM | POA: Diagnosis not present

## 2017-04-17 DIAGNOSIS — M9904 Segmental and somatic dysfunction of sacral region: Secondary | ICD-10-CM | POA: Diagnosis not present

## 2017-04-17 DIAGNOSIS — M9903 Segmental and somatic dysfunction of lumbar region: Secondary | ICD-10-CM | POA: Diagnosis not present

## 2017-04-17 DIAGNOSIS — M25551 Pain in right hip: Secondary | ICD-10-CM | POA: Diagnosis not present

## 2017-04-20 DIAGNOSIS — M25551 Pain in right hip: Secondary | ICD-10-CM | POA: Diagnosis not present

## 2017-04-20 DIAGNOSIS — M9903 Segmental and somatic dysfunction of lumbar region: Secondary | ICD-10-CM | POA: Diagnosis not present

## 2017-04-20 DIAGNOSIS — M170 Bilateral primary osteoarthritis of knee: Secondary | ICD-10-CM | POA: Diagnosis not present

## 2017-04-20 DIAGNOSIS — M9904 Segmental and somatic dysfunction of sacral region: Secondary | ICD-10-CM | POA: Diagnosis not present

## 2017-04-20 DIAGNOSIS — M25562 Pain in left knee: Secondary | ICD-10-CM | POA: Diagnosis not present

## 2017-04-20 DIAGNOSIS — M25561 Pain in right knee: Secondary | ICD-10-CM | POA: Diagnosis not present

## 2017-04-20 DIAGNOSIS — M9905 Segmental and somatic dysfunction of pelvic region: Secondary | ICD-10-CM | POA: Diagnosis not present

## 2017-04-20 DIAGNOSIS — M25552 Pain in left hip: Secondary | ICD-10-CM | POA: Diagnosis not present

## 2017-04-20 DIAGNOSIS — M5136 Other intervertebral disc degeneration, lumbar region: Secondary | ICD-10-CM | POA: Diagnosis not present

## 2017-04-24 DIAGNOSIS — M170 Bilateral primary osteoarthritis of knee: Secondary | ICD-10-CM | POA: Diagnosis not present

## 2017-04-24 DIAGNOSIS — M9903 Segmental and somatic dysfunction of lumbar region: Secondary | ICD-10-CM | POA: Diagnosis not present

## 2017-04-24 DIAGNOSIS — M25562 Pain in left knee: Secondary | ICD-10-CM | POA: Diagnosis not present

## 2017-04-24 DIAGNOSIS — M25561 Pain in right knee: Secondary | ICD-10-CM | POA: Diagnosis not present

## 2017-04-24 DIAGNOSIS — M9905 Segmental and somatic dysfunction of pelvic region: Secondary | ICD-10-CM | POA: Diagnosis not present

## 2017-04-24 DIAGNOSIS — M25551 Pain in right hip: Secondary | ICD-10-CM | POA: Diagnosis not present

## 2017-04-24 DIAGNOSIS — M5136 Other intervertebral disc degeneration, lumbar region: Secondary | ICD-10-CM | POA: Diagnosis not present

## 2017-04-24 DIAGNOSIS — M9904 Segmental and somatic dysfunction of sacral region: Secondary | ICD-10-CM | POA: Diagnosis not present

## 2017-04-24 DIAGNOSIS — M25552 Pain in left hip: Secondary | ICD-10-CM | POA: Diagnosis not present

## 2017-04-25 DIAGNOSIS — M9903 Segmental and somatic dysfunction of lumbar region: Secondary | ICD-10-CM | POA: Diagnosis not present

## 2017-04-25 DIAGNOSIS — M25551 Pain in right hip: Secondary | ICD-10-CM | POA: Diagnosis not present

## 2017-04-25 DIAGNOSIS — M25562 Pain in left knee: Secondary | ICD-10-CM | POA: Diagnosis not present

## 2017-04-25 DIAGNOSIS — M25552 Pain in left hip: Secondary | ICD-10-CM | POA: Diagnosis not present

## 2017-04-25 DIAGNOSIS — M9904 Segmental and somatic dysfunction of sacral region: Secondary | ICD-10-CM | POA: Diagnosis not present

## 2017-04-25 DIAGNOSIS — M5136 Other intervertebral disc degeneration, lumbar region: Secondary | ICD-10-CM | POA: Diagnosis not present

## 2017-04-25 DIAGNOSIS — M25561 Pain in right knee: Secondary | ICD-10-CM | POA: Diagnosis not present

## 2017-04-25 DIAGNOSIS — M170 Bilateral primary osteoarthritis of knee: Secondary | ICD-10-CM | POA: Diagnosis not present

## 2017-04-25 DIAGNOSIS — M9905 Segmental and somatic dysfunction of pelvic region: Secondary | ICD-10-CM | POA: Diagnosis not present

## 2017-04-27 DIAGNOSIS — M9903 Segmental and somatic dysfunction of lumbar region: Secondary | ICD-10-CM | POA: Diagnosis not present

## 2017-04-27 DIAGNOSIS — M25562 Pain in left knee: Secondary | ICD-10-CM | POA: Diagnosis not present

## 2017-04-27 DIAGNOSIS — M5136 Other intervertebral disc degeneration, lumbar region: Secondary | ICD-10-CM | POA: Diagnosis not present

## 2017-04-27 DIAGNOSIS — M170 Bilateral primary osteoarthritis of knee: Secondary | ICD-10-CM | POA: Diagnosis not present

## 2017-04-27 DIAGNOSIS — M25552 Pain in left hip: Secondary | ICD-10-CM | POA: Diagnosis not present

## 2017-04-27 DIAGNOSIS — M9905 Segmental and somatic dysfunction of pelvic region: Secondary | ICD-10-CM | POA: Diagnosis not present

## 2017-04-27 DIAGNOSIS — M25551 Pain in right hip: Secondary | ICD-10-CM | POA: Diagnosis not present

## 2017-04-27 DIAGNOSIS — M9904 Segmental and somatic dysfunction of sacral region: Secondary | ICD-10-CM | POA: Diagnosis not present

## 2017-04-27 DIAGNOSIS — M25561 Pain in right knee: Secondary | ICD-10-CM | POA: Diagnosis not present

## 2017-05-01 DIAGNOSIS — M170 Bilateral primary osteoarthritis of knee: Secondary | ICD-10-CM | POA: Diagnosis not present

## 2017-05-01 DIAGNOSIS — I10 Essential (primary) hypertension: Secondary | ICD-10-CM | POA: Diagnosis not present

## 2017-05-01 DIAGNOSIS — M9904 Segmental and somatic dysfunction of sacral region: Secondary | ICD-10-CM | POA: Diagnosis not present

## 2017-05-01 DIAGNOSIS — Z87442 Personal history of urinary calculi: Secondary | ICD-10-CM | POA: Diagnosis not present

## 2017-05-01 DIAGNOSIS — M25561 Pain in right knee: Secondary | ICD-10-CM | POA: Diagnosis not present

## 2017-05-01 DIAGNOSIS — M25562 Pain in left knee: Secondary | ICD-10-CM | POA: Diagnosis not present

## 2017-05-01 DIAGNOSIS — M25551 Pain in right hip: Secondary | ICD-10-CM | POA: Diagnosis not present

## 2017-05-01 DIAGNOSIS — M25552 Pain in left hip: Secondary | ICD-10-CM | POA: Diagnosis not present

## 2017-05-01 DIAGNOSIS — M5136 Other intervertebral disc degeneration, lumbar region: Secondary | ICD-10-CM | POA: Diagnosis not present

## 2017-05-01 DIAGNOSIS — N183 Chronic kidney disease, stage 3 (moderate): Secondary | ICD-10-CM | POA: Diagnosis not present

## 2017-05-01 DIAGNOSIS — R600 Localized edema: Secondary | ICD-10-CM | POA: Diagnosis not present

## 2017-05-01 DIAGNOSIS — M9905 Segmental and somatic dysfunction of pelvic region: Secondary | ICD-10-CM | POA: Diagnosis not present

## 2017-05-01 DIAGNOSIS — N179 Acute kidney failure, unspecified: Secondary | ICD-10-CM | POA: Diagnosis not present

## 2017-05-01 DIAGNOSIS — Z6841 Body Mass Index (BMI) 40.0 and over, adult: Secondary | ICD-10-CM | POA: Diagnosis not present

## 2017-05-01 DIAGNOSIS — M9903 Segmental and somatic dysfunction of lumbar region: Secondary | ICD-10-CM | POA: Diagnosis not present

## 2017-05-01 DIAGNOSIS — R809 Proteinuria, unspecified: Secondary | ICD-10-CM | POA: Diagnosis not present

## 2017-05-02 DIAGNOSIS — M25562 Pain in left knee: Secondary | ICD-10-CM | POA: Diagnosis not present

## 2017-05-02 DIAGNOSIS — M170 Bilateral primary osteoarthritis of knee: Secondary | ICD-10-CM | POA: Diagnosis not present

## 2017-05-02 DIAGNOSIS — M9904 Segmental and somatic dysfunction of sacral region: Secondary | ICD-10-CM | POA: Diagnosis not present

## 2017-05-02 DIAGNOSIS — M25552 Pain in left hip: Secondary | ICD-10-CM | POA: Diagnosis not present

## 2017-05-02 DIAGNOSIS — M5136 Other intervertebral disc degeneration, lumbar region: Secondary | ICD-10-CM | POA: Diagnosis not present

## 2017-05-02 DIAGNOSIS — M25551 Pain in right hip: Secondary | ICD-10-CM | POA: Diagnosis not present

## 2017-05-02 DIAGNOSIS — M25561 Pain in right knee: Secondary | ICD-10-CM | POA: Diagnosis not present

## 2017-05-02 DIAGNOSIS — M9903 Segmental and somatic dysfunction of lumbar region: Secondary | ICD-10-CM | POA: Diagnosis not present

## 2017-05-02 DIAGNOSIS — M9905 Segmental and somatic dysfunction of pelvic region: Secondary | ICD-10-CM | POA: Diagnosis not present

## 2017-05-03 DIAGNOSIS — M170 Bilateral primary osteoarthritis of knee: Secondary | ICD-10-CM | POA: Diagnosis not present

## 2017-05-03 DIAGNOSIS — M5136 Other intervertebral disc degeneration, lumbar region: Secondary | ICD-10-CM | POA: Diagnosis not present

## 2017-05-03 DIAGNOSIS — M9903 Segmental and somatic dysfunction of lumbar region: Secondary | ICD-10-CM | POA: Diagnosis not present

## 2017-05-03 DIAGNOSIS — M25551 Pain in right hip: Secondary | ICD-10-CM | POA: Diagnosis not present

## 2017-05-03 DIAGNOSIS — M25561 Pain in right knee: Secondary | ICD-10-CM | POA: Diagnosis not present

## 2017-05-03 DIAGNOSIS — M25552 Pain in left hip: Secondary | ICD-10-CM | POA: Diagnosis not present

## 2017-05-03 DIAGNOSIS — M9905 Segmental and somatic dysfunction of pelvic region: Secondary | ICD-10-CM | POA: Diagnosis not present

## 2017-05-03 DIAGNOSIS — M25562 Pain in left knee: Secondary | ICD-10-CM | POA: Diagnosis not present

## 2017-05-03 DIAGNOSIS — M9904 Segmental and somatic dysfunction of sacral region: Secondary | ICD-10-CM | POA: Diagnosis not present

## 2017-05-08 DIAGNOSIS — M9903 Segmental and somatic dysfunction of lumbar region: Secondary | ICD-10-CM | POA: Diagnosis not present

## 2017-05-08 DIAGNOSIS — M9905 Segmental and somatic dysfunction of pelvic region: Secondary | ICD-10-CM | POA: Diagnosis not present

## 2017-05-08 DIAGNOSIS — M25551 Pain in right hip: Secondary | ICD-10-CM | POA: Diagnosis not present

## 2017-05-08 DIAGNOSIS — M25561 Pain in right knee: Secondary | ICD-10-CM | POA: Diagnosis not present

## 2017-05-08 DIAGNOSIS — M170 Bilateral primary osteoarthritis of knee: Secondary | ICD-10-CM | POA: Diagnosis not present

## 2017-05-08 DIAGNOSIS — M9904 Segmental and somatic dysfunction of sacral region: Secondary | ICD-10-CM | POA: Diagnosis not present

## 2017-05-08 DIAGNOSIS — M25552 Pain in left hip: Secondary | ICD-10-CM | POA: Diagnosis not present

## 2017-05-08 DIAGNOSIS — M5136 Other intervertebral disc degeneration, lumbar region: Secondary | ICD-10-CM | POA: Diagnosis not present

## 2017-05-08 DIAGNOSIS — M25562 Pain in left knee: Secondary | ICD-10-CM | POA: Diagnosis not present

## 2017-05-15 DIAGNOSIS — M25552 Pain in left hip: Secondary | ICD-10-CM | POA: Diagnosis not present

## 2017-05-15 DIAGNOSIS — M9903 Segmental and somatic dysfunction of lumbar region: Secondary | ICD-10-CM | POA: Diagnosis not present

## 2017-05-15 DIAGNOSIS — M25551 Pain in right hip: Secondary | ICD-10-CM | POA: Diagnosis not present

## 2017-05-15 DIAGNOSIS — M25561 Pain in right knee: Secondary | ICD-10-CM | POA: Diagnosis not present

## 2017-05-15 DIAGNOSIS — M9904 Segmental and somatic dysfunction of sacral region: Secondary | ICD-10-CM | POA: Diagnosis not present

## 2017-05-15 DIAGNOSIS — M9905 Segmental and somatic dysfunction of pelvic region: Secondary | ICD-10-CM | POA: Diagnosis not present

## 2017-05-15 DIAGNOSIS — M5136 Other intervertebral disc degeneration, lumbar region: Secondary | ICD-10-CM | POA: Diagnosis not present

## 2017-05-15 DIAGNOSIS — M170 Bilateral primary osteoarthritis of knee: Secondary | ICD-10-CM | POA: Diagnosis not present

## 2017-05-15 DIAGNOSIS — M25562 Pain in left knee: Secondary | ICD-10-CM | POA: Diagnosis not present

## 2017-05-22 DIAGNOSIS — M25561 Pain in right knee: Secondary | ICD-10-CM | POA: Diagnosis not present

## 2017-05-22 DIAGNOSIS — M25551 Pain in right hip: Secondary | ICD-10-CM | POA: Diagnosis not present

## 2017-05-22 DIAGNOSIS — Z125 Encounter for screening for malignant neoplasm of prostate: Secondary | ICD-10-CM | POA: Diagnosis not present

## 2017-05-22 DIAGNOSIS — M170 Bilateral primary osteoarthritis of knee: Secondary | ICD-10-CM | POA: Diagnosis not present

## 2017-05-22 DIAGNOSIS — M25552 Pain in left hip: Secondary | ICD-10-CM | POA: Diagnosis not present

## 2017-05-22 DIAGNOSIS — M9904 Segmental and somatic dysfunction of sacral region: Secondary | ICD-10-CM | POA: Diagnosis not present

## 2017-05-22 DIAGNOSIS — M25562 Pain in left knee: Secondary | ICD-10-CM | POA: Diagnosis not present

## 2017-05-22 DIAGNOSIS — M9905 Segmental and somatic dysfunction of pelvic region: Secondary | ICD-10-CM | POA: Diagnosis not present

## 2017-05-22 DIAGNOSIS — M5136 Other intervertebral disc degeneration, lumbar region: Secondary | ICD-10-CM | POA: Diagnosis not present

## 2017-05-22 DIAGNOSIS — I10 Essential (primary) hypertension: Secondary | ICD-10-CM | POA: Diagnosis not present

## 2017-05-22 DIAGNOSIS — E119 Type 2 diabetes mellitus without complications: Secondary | ICD-10-CM | POA: Diagnosis not present

## 2017-05-22 DIAGNOSIS — M9903 Segmental and somatic dysfunction of lumbar region: Secondary | ICD-10-CM | POA: Diagnosis not present

## 2017-05-30 DIAGNOSIS — M25551 Pain in right hip: Secondary | ICD-10-CM | POA: Diagnosis not present

## 2017-05-30 DIAGNOSIS — M25552 Pain in left hip: Secondary | ICD-10-CM | POA: Diagnosis not present

## 2017-05-30 DIAGNOSIS — M9904 Segmental and somatic dysfunction of sacral region: Secondary | ICD-10-CM | POA: Diagnosis not present

## 2017-05-30 DIAGNOSIS — M170 Bilateral primary osteoarthritis of knee: Secondary | ICD-10-CM | POA: Diagnosis not present

## 2017-05-30 DIAGNOSIS — M25561 Pain in right knee: Secondary | ICD-10-CM | POA: Diagnosis not present

## 2017-05-30 DIAGNOSIS — M25562 Pain in left knee: Secondary | ICD-10-CM | POA: Diagnosis not present

## 2017-05-30 DIAGNOSIS — M9905 Segmental and somatic dysfunction of pelvic region: Secondary | ICD-10-CM | POA: Diagnosis not present

## 2017-05-30 DIAGNOSIS — M5136 Other intervertebral disc degeneration, lumbar region: Secondary | ICD-10-CM | POA: Diagnosis not present

## 2017-05-30 DIAGNOSIS — M9903 Segmental and somatic dysfunction of lumbar region: Secondary | ICD-10-CM | POA: Diagnosis not present

## 2017-06-01 DIAGNOSIS — E119 Type 2 diabetes mellitus without complications: Secondary | ICD-10-CM | POA: Diagnosis not present

## 2017-06-01 DIAGNOSIS — I10 Essential (primary) hypertension: Secondary | ICD-10-CM | POA: Diagnosis not present

## 2017-06-01 DIAGNOSIS — Z0000 Encounter for general adult medical examination without abnormal findings: Secondary | ICD-10-CM | POA: Diagnosis not present

## 2017-06-06 DIAGNOSIS — M25561 Pain in right knee: Secondary | ICD-10-CM | POA: Diagnosis not present

## 2017-06-06 DIAGNOSIS — M9904 Segmental and somatic dysfunction of sacral region: Secondary | ICD-10-CM | POA: Diagnosis not present

## 2017-06-06 DIAGNOSIS — M5136 Other intervertebral disc degeneration, lumbar region: Secondary | ICD-10-CM | POA: Diagnosis not present

## 2017-06-06 DIAGNOSIS — M25562 Pain in left knee: Secondary | ICD-10-CM | POA: Diagnosis not present

## 2017-06-06 DIAGNOSIS — M9903 Segmental and somatic dysfunction of lumbar region: Secondary | ICD-10-CM | POA: Diagnosis not present

## 2017-06-06 DIAGNOSIS — M170 Bilateral primary osteoarthritis of knee: Secondary | ICD-10-CM | POA: Diagnosis not present

## 2017-06-06 DIAGNOSIS — M25552 Pain in left hip: Secondary | ICD-10-CM | POA: Diagnosis not present

## 2017-06-06 DIAGNOSIS — M25551 Pain in right hip: Secondary | ICD-10-CM | POA: Diagnosis not present

## 2017-06-06 DIAGNOSIS — M9905 Segmental and somatic dysfunction of pelvic region: Secondary | ICD-10-CM | POA: Diagnosis not present

## 2017-06-20 DIAGNOSIS — M25562 Pain in left knee: Secondary | ICD-10-CM | POA: Diagnosis not present

## 2017-06-20 DIAGNOSIS — M25552 Pain in left hip: Secondary | ICD-10-CM | POA: Diagnosis not present

## 2017-06-20 DIAGNOSIS — M9904 Segmental and somatic dysfunction of sacral region: Secondary | ICD-10-CM | POA: Diagnosis not present

## 2017-06-20 DIAGNOSIS — M170 Bilateral primary osteoarthritis of knee: Secondary | ICD-10-CM | POA: Diagnosis not present

## 2017-06-20 DIAGNOSIS — M25561 Pain in right knee: Secondary | ICD-10-CM | POA: Diagnosis not present

## 2017-06-20 DIAGNOSIS — M5136 Other intervertebral disc degeneration, lumbar region: Secondary | ICD-10-CM | POA: Diagnosis not present

## 2017-06-20 DIAGNOSIS — M9905 Segmental and somatic dysfunction of pelvic region: Secondary | ICD-10-CM | POA: Diagnosis not present

## 2017-06-20 DIAGNOSIS — M25551 Pain in right hip: Secondary | ICD-10-CM | POA: Diagnosis not present

## 2017-06-20 DIAGNOSIS — M9903 Segmental and somatic dysfunction of lumbar region: Secondary | ICD-10-CM | POA: Diagnosis not present

## 2017-09-04 DIAGNOSIS — M1712 Unilateral primary osteoarthritis, left knee: Secondary | ICD-10-CM | POA: Diagnosis not present

## 2017-09-04 DIAGNOSIS — M1711 Unilateral primary osteoarthritis, right knee: Secondary | ICD-10-CM | POA: Diagnosis not present

## 2017-09-13 DIAGNOSIS — R040 Epistaxis: Secondary | ICD-10-CM | POA: Diagnosis not present

## 2017-09-14 DIAGNOSIS — R040 Epistaxis: Secondary | ICD-10-CM | POA: Diagnosis not present

## 2017-09-21 DIAGNOSIS — E119 Type 2 diabetes mellitus without complications: Secondary | ICD-10-CM | POA: Diagnosis not present

## 2017-09-21 DIAGNOSIS — I10 Essential (primary) hypertension: Secondary | ICD-10-CM | POA: Diagnosis not present

## 2017-09-28 DIAGNOSIS — E119 Type 2 diabetes mellitus without complications: Secondary | ICD-10-CM | POA: Diagnosis not present

## 2017-09-28 DIAGNOSIS — I10 Essential (primary) hypertension: Secondary | ICD-10-CM | POA: Diagnosis not present

## 2017-09-28 DIAGNOSIS — E7800 Pure hypercholesterolemia, unspecified: Secondary | ICD-10-CM | POA: Diagnosis not present

## 2017-11-22 DIAGNOSIS — N183 Chronic kidney disease, stage 3 (moderate): Secondary | ICD-10-CM | POA: Diagnosis not present

## 2017-11-22 DIAGNOSIS — I129 Hypertensive chronic kidney disease with stage 1 through stage 4 chronic kidney disease, or unspecified chronic kidney disease: Secondary | ICD-10-CM | POA: Diagnosis not present

## 2017-11-22 DIAGNOSIS — Z6841 Body Mass Index (BMI) 40.0 and over, adult: Secondary | ICD-10-CM | POA: Diagnosis not present

## 2017-11-22 DIAGNOSIS — E1122 Type 2 diabetes mellitus with diabetic chronic kidney disease: Secondary | ICD-10-CM | POA: Diagnosis not present

## 2017-11-22 DIAGNOSIS — Z87442 Personal history of urinary calculi: Secondary | ICD-10-CM | POA: Diagnosis not present

## 2017-11-22 DIAGNOSIS — R809 Proteinuria, unspecified: Secondary | ICD-10-CM | POA: Diagnosis not present

## 2018-01-22 DIAGNOSIS — I10 Essential (primary) hypertension: Secondary | ICD-10-CM | POA: Diagnosis not present

## 2018-01-22 DIAGNOSIS — E119 Type 2 diabetes mellitus without complications: Secondary | ICD-10-CM | POA: Diagnosis not present

## 2018-01-29 DIAGNOSIS — I10 Essential (primary) hypertension: Secondary | ICD-10-CM | POA: Diagnosis not present

## 2018-01-29 DIAGNOSIS — E7800 Pure hypercholesterolemia, unspecified: Secondary | ICD-10-CM | POA: Diagnosis not present

## 2018-01-29 DIAGNOSIS — Z0000 Encounter for general adult medical examination without abnormal findings: Secondary | ICD-10-CM | POA: Diagnosis not present

## 2018-01-29 DIAGNOSIS — E119 Type 2 diabetes mellitus without complications: Secondary | ICD-10-CM | POA: Diagnosis not present

## 2018-02-14 DIAGNOSIS — I10 Essential (primary) hypertension: Secondary | ICD-10-CM | POA: Diagnosis not present

## 2018-02-14 DIAGNOSIS — E119 Type 2 diabetes mellitus without complications: Secondary | ICD-10-CM | POA: Diagnosis not present

## 2018-02-14 DIAGNOSIS — Z1211 Encounter for screening for malignant neoplasm of colon: Secondary | ICD-10-CM | POA: Diagnosis not present

## 2018-02-15 ENCOUNTER — Other Ambulatory Visit: Payer: Self-pay | Admitting: Gastroenterology

## 2018-03-02 ENCOUNTER — Encounter (HOSPITAL_COMMUNITY)
Admission: RE | Disposition: A | Discharge status: Home / Self Care | Payer: Self-pay | Source: Ambulatory Visit | Attending: Gastroenterology

## 2018-03-02 ENCOUNTER — Ambulatory Visit (HOSPITAL_COMMUNITY): Payer: Medicare HMO | Admitting: Anesthesiology

## 2018-03-02 ENCOUNTER — Other Ambulatory Visit: Payer: Self-pay

## 2018-03-02 ENCOUNTER — Ambulatory Visit (HOSPITAL_COMMUNITY)
Admission: RE | Admit: 2018-03-02 | Discharge: 2018-03-02 | Disposition: A | Discharge status: Home / Self Care | Payer: Medicare HMO | Source: Ambulatory Visit | Attending: Gastroenterology | Admitting: Gastroenterology

## 2018-03-02 ENCOUNTER — Encounter (HOSPITAL_COMMUNITY): Payer: Self-pay | Admitting: *Deleted

## 2018-03-02 DIAGNOSIS — Z79899 Other long term (current) drug therapy: Secondary | ICD-10-CM | POA: Insufficient documentation

## 2018-03-02 DIAGNOSIS — I10 Essential (primary) hypertension: Secondary | ICD-10-CM | POA: Insufficient documentation

## 2018-03-02 DIAGNOSIS — Z885 Allergy status to narcotic agent status: Secondary | ICD-10-CM | POA: Insufficient documentation

## 2018-03-02 DIAGNOSIS — D125 Benign neoplasm of sigmoid colon: Secondary | ICD-10-CM | POA: Diagnosis not present

## 2018-03-02 DIAGNOSIS — E119 Type 2 diabetes mellitus without complications: Secondary | ICD-10-CM | POA: Insufficient documentation

## 2018-03-02 DIAGNOSIS — Z1211 Encounter for screening for malignant neoplasm of colon: Secondary | ICD-10-CM | POA: Diagnosis not present

## 2018-03-02 DIAGNOSIS — Z6841 Body Mass Index (BMI) 40.0 and over, adult: Secondary | ICD-10-CM | POA: Insufficient documentation

## 2018-03-02 DIAGNOSIS — Z7982 Long term (current) use of aspirin: Secondary | ICD-10-CM | POA: Diagnosis not present

## 2018-03-02 DIAGNOSIS — Z7984 Long term (current) use of oral hypoglycemic drugs: Secondary | ICD-10-CM | POA: Insufficient documentation

## 2018-03-02 DIAGNOSIS — Z87891 Personal history of nicotine dependence: Secondary | ICD-10-CM | POA: Diagnosis not present

## 2018-03-02 DIAGNOSIS — M1990 Unspecified osteoarthritis, unspecified site: Secondary | ICD-10-CM | POA: Diagnosis not present

## 2018-03-02 DIAGNOSIS — D123 Benign neoplasm of transverse colon: Secondary | ICD-10-CM | POA: Insufficient documentation

## 2018-03-02 HISTORY — PX: COLONOSCOPY WITH PROPOFOL: SHX5780

## 2018-03-02 HISTORY — PX: POLYPECTOMY: SHX5525

## 2018-03-02 HISTORY — PX: SUBMUCOSAL INJECTION: SHX5543

## 2018-03-02 LAB — GLUCOSE, CAPILLARY: Glucose-Capillary: 96 mg/dL (ref 70–99)

## 2018-03-02 SURGERY — COLONOSCOPY WITH PROPOFOL
Anesthesia: Monitor Anesthesia Care

## 2018-03-02 MED ORDER — LIDOCAINE HCL 1 % IJ SOLN
INTRAMUSCULAR | Status: DC | PRN
  Administered 2018-03-02: 30 mg via INTRADERMAL

## 2018-03-02 MED ORDER — PROPOFOL 500 MG/50ML IV EMUL
INTRAVENOUS | Status: DC | PRN
  Administered 2018-03-02: 150 ug/kg/min via INTRAVENOUS

## 2018-03-02 MED ORDER — PROPOFOL 10 MG/ML IV BOLUS
INTRAVENOUS | Status: DC | PRN
  Administered 2018-03-02 (×3): 20 mg via INTRAVENOUS

## 2018-03-02 MED ORDER — LACTATED RINGERS IV SOLN
INTRAVENOUS | Status: DC
  Administered 2018-03-02: 1000 mL via INTRAVENOUS

## 2018-03-02 MED ORDER — SODIUM CHLORIDE 0.9 % IV SOLN: INTRAVENOUS | Status: DC

## 2018-03-02 SURGICAL SUPPLY — 21 items

## 2018-03-02 NOTE — Op Note (Signed)
[facility] Patient Name: Lance Gregory Procedure Date: 03/02/2018 MRN: [id number] Attending MD: Carol Ada , MD Date of Birth: 11/15/1950 CSN: [id number] Age: 67 Admit Type: Inpatient Procedure:                Colonoscopy Indications:              Screening for colorectal malignant neoplasm Providers:                Carol Ada, MD, Burtis Junes, RN, Alan Mulder,                            Technician, Karis Juba, CRNA Referring MD:              Medicines:                 Complications:            No immediate complications. Estimated Blood Loss:     Estimated blood loss: none. Procedure:                Pre-Anesthesia Assessment:                           - Prior to the procedure, a History and Physical                            was performed, and patient medications and                            allergies were reviewed. The patient's tolerance of                            previous anesthesia was also reviewed. The risks                            and benefits of the procedure and the sedation                            options and risks were discussed with the patient.                            All questions were answered, and informed consent                            was obtained. Prior Anticoagulants: The patient has                            taken no previous anticoagulant or antiplatelet                            agents. ASA Grade Assessment: III - A patient with                            severe systemic disease. After reviewing the risks  and benefits, the patient was deemed in                            satisfactory condition to undergo the procedure.                           - Sedation was administered by an anesthesia                            professional. Deep sedation was attained.                           After obtaining informed consent, the colonoscope                            was passed under direct  vision. Throughout the                            procedure, the patient's blood pressure, pulse, and                            oxygen saturations were monitored continuously. The                            CF-HQ190L ([id number] Olympus adult colonoscope was                            introduced through the anus and advanced to the the                            cecum, identified by appendiceal orifice and                            ileocecal valve. The colonoscopy was performed                            without difficulty. The patient tolerated the                            procedure well. The quality of the bowel                            preparation was excellent. The ileocecal valve,                            appendiceal orifice, and rectum were photographed. Scope In: 10:53:31 AM Scope Out: 11:20:56 AM Scope Withdrawal Time: 0 hours 24 minutes 41 seconds  Total Procedure Duration: 0 hours 27 minutes 25 seconds  Findings:      A 12 mm polyp was found in the transverse colon. The polyp was sessile.       The polyp was removed with a hot snare. The polyp was removed with a       saline injection-lift technique using a hot snare. The polyp was removed       with a  piecemeal technique using a hot snare. Resection and retrieval       were complete.      A 4 mm polyp was found in the sigmoid colon. The polyp was sessile. The       polyp was removed with a cold snare. Resection and retrieval were       complete.      The large transverse colon polyp had a larger base and it was removed in       two passes. With the second pass, the proximal portion of the polyp was       between two folds and it was difficult to snare. One and a half ml of       saline was used to inject to evert the polyp. This allowed for       successful snaring and resection with the hot snare. Impression:               - One 12 mm polyp in the transverse colon, removed                            with a hot snare,  removed using injection-lift and                            a hot snare and removed piecemeal using a hot                            snare. Resected and retrieved.                           - One 4 mm polyp in the sigmoid colon, removed with                            a cold snare. Resected and retrieved. Moderate Sedation:      N/A- Per Anesthesia Care Recommendation:           - Patient has a contact number available for                            emergencies. The signs and symptoms of potential                            delayed complications were discussed with the                            patient. Return to normal activities tomorrow.                            Written discharge instructions were provided to the                            patient.                           - Resume previous diet.                           - Continue  present medications.                           - Await pathology results.                           - Repeat colonoscopy in 3 years for surveillance. Procedure Code(s):        --- Professional ---                           [phone], Colonoscopy, flexible; with removal of                            tumor(s), polyp(s), or other lesion(s) by snare                            technique                           [id number], Colonoscopy, flexible; with directed                            submucosal injection(s), any substance Diagnosis Code(s):        --- Professional ---                           Z12.11, Encounter for screening for malignant                            neoplasm of colon                           D12.3, Benign neoplasm of transverse colon (hepatic                            flexure or splenic flexure)                           D12.5, Benign neoplasm of sigmoid colon CPT copyright 2017 [employer]. All rights reserved. The codes documented in this report are preliminary and upon coder review may  be revised to meet current compliance  requirements. Carol Ada, MD Carol Ada, MD 03/02/2018 11:27:09 AM This report has been signed electronically. Number of Addenda: 0

## 2018-03-02 NOTE — Discharge Instructions (Signed)

## 2018-03-02 NOTE — Transfer of Care (Signed)
Immediate Anesthesia Transfer of Care Note  Patient: Lance Hausen Sr.  Procedure(s) Performed: COLONOSCOPY WITH PROPOFOL (N/A )  Patient Location: PACU and Endoscopy Unit  Anesthesia Type:MAC  Level of Consciousness: awake, alert , oriented and patient cooperative  Airway & Oxygen Therapy: Patient Spontanous Breathing and Patient connected to face mask  Post-op Assessment: Report given to RN and Post -op Vital signs reviewed and stable  Post vital signs: Reviewed and stable  Last Vitals:  Vitals Value Taken Time  BP    Temp    Pulse    Resp    SpO2      Last Pain:  Vitals:   03/02/18 0958  TempSrc: Oral  PainSc: 0-No pain         Complications: No apparent anesthesia complications

## 2018-03-02 NOTE — H&P (Signed)
  Lance Hausen Sr. HPI: At this time the patient denies any problems with nausea, vomiting, fevers, chills, abdominal pain, diarrhea, constipation, hematochezia, melena, GERD, or dysphagia. The patient denies any known family history of colon cancers. No complaints of chest pain, SOB, MI, or sleep apnea. He had a colonsocopy 10 years ago and it was normal. He thinks that it was performed with Dr. Lajoyce Corners. His latest A1C was at 6.5%.   Past Medical History:  Diagnosis Date  . Arthritis   . Diabetes mellitus without complication    ORAL MEDS-NO INSULIN  . GERD (gastroesophageal reflux disease)    OCCAS AFTER EATING CERTAIN FOODS  . Hypertension   . Ureteral stone    LEFT  --DISCOMFORT    Past Surgical History:  Procedure Laterality Date  . ANAL FISSURE REPAIR    . CYSTOSCOPY WITH URETEROSCOPY  05/16/2012   Procedure: CYSTOSCOPY WITH URETEROSCOPY;  Surgeon: Molli Hazard, MD;  Location: [facility];  Service: Urology;  Laterality: Left;  left ureteral stent placement  . HOLMIUM LASER APPLICATION  47/42/[id number]   Procedure: HOLMIUM LASER APPLICATION;  Surgeon: Molli Hazard, MD;  Location: [facility];  Service: Urology;  Laterality: Left;  . KNEE ARTHROSCOPY     SEVERAL  SURGERIES BOTH KNEES  . VASCECTOMY      No family history on file.  Social History:  reports that he has quit smoking. He has never used smokeless tobacco. He reports that he drinks alcohol. He reports that he does not use drugs.  Allergies:  Allergies  Allergen Reactions  . Vicodin [Hydrocodone-Acetaminophen] Other (See Comments)    Remembers he didn't like  . Codeine Other (See Comments)    REACTION WAS YEARS AGO-PT DOES NOT REMEMBER WHAT THE REACTION WAS    Medications:  Scheduled:  Continuous: . sodium chloride    . lactated ringers      No results found for this or any previous visit (from the past 24 hour(s)).   No results found.  ROS:  As stated above in the HPI otherwise negative.  There  were no vitals taken for this visit.    PE: Gen: NAD, Alert and Oriented HEENT:  [place]/AT, EOMI Neck: Supple, no LAD Lungs: CTA Bilaterally CV: RRR without M/G/R ABM: Soft, NTND, +BS Ext: No C/C/E  Assessment/Plan: 1) Screening colonoscopy  Lance Gregory D 03/02/2018, 9:40 AM

## 2018-03-02 NOTE — Anesthesia Postprocedure Evaluation (Signed)
Anesthesia Post Note  Patient: Lance Hausen Sr.  Procedure(s) Performed: COLONOSCOPY WITH PROPOFOL (N/A ) POLYPECTOMY SUBMUCOSAL INJECTION saline     Patient location during evaluation: Endoscopy Anesthesia Type: MAC Level of consciousness: awake and sedated Pain management: pain level controlled Vital Signs Assessment: post-procedure vital signs reviewed and stable Respiratory status: spontaneous breathing Cardiovascular status: stable Postop Assessment: no apparent nausea or vomiting Anesthetic complications: no    Last Vitals:  Vitals:   03/02/18 1126 03/02/18 1130  BP: 131/86 (!) 150/84  Pulse: 70 63  Resp: 20 19  Temp:    SpO2: 96% 99%    Last Pain:  Vitals:   03/02/18 0958  TempSrc: Oral  PainSc: 0-No pain   Pain Goal:                 [name],[name]

## 2018-03-02 NOTE — Anesthesia Preprocedure Evaluation (Signed)
Anesthesia Evaluation  Patient identified by MRN, date of birth, ID band Patient awake    Reviewed: Allergy & Precautions, NPO status , Patient's Chart, lab work & pertinent test results  Airway Mallampati: I       Dental no notable dental hx. (+) Teeth Intact   Pulmonary neg pulmonary ROS,    Pulmonary exam normal breath sounds clear to auscultation       Cardiovascular hypertension, Pt. on medications and Pt. on home beta blockers Normal cardiovascular exam Rhythm:Regular Rate:Normal     Neuro/Psych negative neurological ROS  negative psych ROS   GI/Hepatic   Endo/Other  diabetes, Oral Hypoglycemic AgentsMorbid obesity  Renal/GU      Musculoskeletal   Abdominal (+) + obese,   Peds  Hematology   Anesthesia Other Findings   Reproductive/Obstetrics                             Anesthesia Physical Anesthesia Plan  ASA: III  Anesthesia Plan: MAC   Post-op Pain Management:    Induction:   PONV Risk Score and Plan:   Airway Management Planned: Natural Airway, Nasal Cannula and Simple Face Mask  Additional Equipment:   Intra-op Plan:   Post-operative Plan:   Informed Consent: I have reviewed the patients History and Physical, chart, labs and discussed the procedure including the risks, benefits and alternatives for the proposed anesthesia with the patient or authorized representative who has indicated his/her understanding and acceptance.     Plan Discussed with: CRNA  Anesthesia Plan Comments:         Anesthesia Quick Evaluation

## 2018-03-06 ENCOUNTER — Encounter (HOSPITAL_COMMUNITY): Payer: Self-pay | Admitting: Gastroenterology

## 2018-03-29 DIAGNOSIS — Z23 Encounter for immunization: Secondary | ICD-10-CM | POA: Diagnosis not present

## 2018-05-29 DIAGNOSIS — H3589 Other specified retinal disorders: Secondary | ICD-10-CM | POA: Diagnosis not present

## 2018-05-29 DIAGNOSIS — H2513 Age-related nuclear cataract, bilateral: Secondary | ICD-10-CM | POA: Diagnosis not present

## 2018-05-29 DIAGNOSIS — H5203 Hypermetropia, bilateral: Secondary | ICD-10-CM | POA: Diagnosis not present

## 2018-05-29 DIAGNOSIS — E119 Type 2 diabetes mellitus without complications: Secondary | ICD-10-CM | POA: Diagnosis not present

## 2018-05-29 DIAGNOSIS — I10 Essential (primary) hypertension: Secondary | ICD-10-CM | POA: Diagnosis not present

## 2018-05-29 DIAGNOSIS — H40013 Open angle with borderline findings, low risk, bilateral: Secondary | ICD-10-CM | POA: Diagnosis not present

## 2018-05-29 DIAGNOSIS — Z0000 Encounter for general adult medical examination without abnormal findings: Secondary | ICD-10-CM | POA: Diagnosis not present

## 2018-05-29 DIAGNOSIS — H524 Presbyopia: Secondary | ICD-10-CM | POA: Diagnosis not present

## 2018-05-29 DIAGNOSIS — Z125 Encounter for screening for malignant neoplasm of prostate: Secondary | ICD-10-CM | POA: Diagnosis not present

## 2018-06-05 DIAGNOSIS — Z0000 Encounter for general adult medical examination without abnormal findings: Secondary | ICD-10-CM | POA: Diagnosis not present

## 2018-06-05 DIAGNOSIS — M10071 Idiopathic gout, right ankle and foot: Secondary | ICD-10-CM | POA: Diagnosis not present

## 2018-06-05 DIAGNOSIS — N183 Chronic kidney disease, stage 3 (moderate): Secondary | ICD-10-CM | POA: Diagnosis not present

## 2018-06-05 DIAGNOSIS — E7800 Pure hypercholesterolemia, unspecified: Secondary | ICD-10-CM | POA: Diagnosis not present

## 2018-06-05 DIAGNOSIS — Z6841 Body Mass Index (BMI) 40.0 and over, adult: Secondary | ICD-10-CM | POA: Diagnosis not present

## 2018-06-05 DIAGNOSIS — E1122 Type 2 diabetes mellitus with diabetic chronic kidney disease: Secondary | ICD-10-CM | POA: Diagnosis not present

## 2018-06-05 DIAGNOSIS — I10 Essential (primary) hypertension: Secondary | ICD-10-CM | POA: Diagnosis not present

## 2018-08-06 ENCOUNTER — Other Ambulatory Visit: Payer: Self-pay | Admitting: Family Medicine

## 2018-08-06 ENCOUNTER — Other Ambulatory Visit (HOSPITAL_COMMUNITY): Payer: Self-pay | Admitting: Family Medicine

## 2018-08-06 DIAGNOSIS — N200 Calculus of kidney: Secondary | ICD-10-CM

## 2018-09-19 DIAGNOSIS — M25562 Pain in left knee: Secondary | ICD-10-CM | POA: Diagnosis not present

## 2018-09-19 DIAGNOSIS — M25561 Pain in right knee: Secondary | ICD-10-CM | POA: Diagnosis not present

## 2018-09-19 DIAGNOSIS — M150 Primary generalized (osteo)arthritis: Secondary | ICD-10-CM | POA: Diagnosis not present

## 2018-09-19 DIAGNOSIS — Z79899 Other long term (current) drug therapy: Secondary | ICD-10-CM | POA: Diagnosis not present

## 2018-09-19 DIAGNOSIS — N183 Chronic kidney disease, stage 3 (moderate): Secondary | ICD-10-CM | POA: Diagnosis not present

## 2018-09-19 DIAGNOSIS — M109 Gout, unspecified: Secondary | ICD-10-CM | POA: Diagnosis not present

## 2018-09-19 DIAGNOSIS — E790 Hyperuricemia without signs of inflammatory arthritis and tophaceous disease: Secondary | ICD-10-CM | POA: Diagnosis not present

## 2018-09-19 DIAGNOSIS — M170 Bilateral primary osteoarthritis of knee: Secondary | ICD-10-CM | POA: Diagnosis not present

## 2018-10-02 DIAGNOSIS — M545 Low back pain: Secondary | ICD-10-CM | POA: Diagnosis not present

## 2018-10-02 DIAGNOSIS — Z6841 Body Mass Index (BMI) 40.0 and over, adult: Secondary | ICD-10-CM | POA: Diagnosis not present

## 2018-10-02 DIAGNOSIS — M109 Gout, unspecified: Secondary | ICD-10-CM | POA: Insufficient documentation

## 2018-10-02 DIAGNOSIS — E119 Type 2 diabetes mellitus without complications: Secondary | ICD-10-CM | POA: Insufficient documentation

## 2018-10-02 DIAGNOSIS — I10 Essential (primary) hypertension: Secondary | ICD-10-CM | POA: Insufficient documentation

## 2018-10-02 DIAGNOSIS — E7800 Pure hypercholesterolemia, unspecified: Secondary | ICD-10-CM | POA: Insufficient documentation

## 2018-10-03 DIAGNOSIS — E7800 Pure hypercholesterolemia, unspecified: Secondary | ICD-10-CM | POA: Diagnosis not present

## 2018-10-03 DIAGNOSIS — Z125 Encounter for screening for malignant neoplasm of prostate: Secondary | ICD-10-CM | POA: Diagnosis not present

## 2018-10-03 DIAGNOSIS — I10 Essential (primary) hypertension: Secondary | ICD-10-CM | POA: Diagnosis not present

## 2018-10-03 DIAGNOSIS — M109 Gout, unspecified: Secondary | ICD-10-CM | POA: Diagnosis not present

## 2018-10-03 DIAGNOSIS — Z0000 Encounter for general adult medical examination without abnormal findings: Secondary | ICD-10-CM | POA: Diagnosis not present

## 2018-10-03 DIAGNOSIS — E118 Type 2 diabetes mellitus with unspecified complications: Secondary | ICD-10-CM | POA: Diagnosis not present

## 2018-10-03 DIAGNOSIS — N183 Chronic kidney disease, stage 3 (moderate): Secondary | ICD-10-CM | POA: Diagnosis not present

## 2018-10-10 DIAGNOSIS — M549 Dorsalgia, unspecified: Secondary | ICD-10-CM | POA: Diagnosis not present

## 2018-10-10 DIAGNOSIS — E118 Type 2 diabetes mellitus with unspecified complications: Secondary | ICD-10-CM | POA: Diagnosis not present

## 2018-10-10 DIAGNOSIS — E7800 Pure hypercholesterolemia, unspecified: Secondary | ICD-10-CM | POA: Diagnosis not present

## 2018-10-10 DIAGNOSIS — I10 Essential (primary) hypertension: Secondary | ICD-10-CM | POA: Diagnosis not present

## 2018-10-10 DIAGNOSIS — M1990 Unspecified osteoarthritis, unspecified site: Secondary | ICD-10-CM | POA: Diagnosis not present

## 2018-10-10 DIAGNOSIS — N183 Chronic kidney disease, stage 3 (moderate): Secondary | ICD-10-CM | POA: Diagnosis not present

## 2018-10-10 DIAGNOSIS — M109 Gout, unspecified: Secondary | ICD-10-CM | POA: Diagnosis not present

## 2018-11-08 DIAGNOSIS — M5136 Other intervertebral disc degeneration, lumbar region: Secondary | ICD-10-CM | POA: Insufficient documentation

## 2019-02-06 DIAGNOSIS — R739 Hyperglycemia, unspecified: Secondary | ICD-10-CM | POA: Diagnosis not present

## 2019-02-06 DIAGNOSIS — I10 Essential (primary) hypertension: Secondary | ICD-10-CM | POA: Diagnosis not present

## 2019-02-06 DIAGNOSIS — E785 Hyperlipidemia, unspecified: Secondary | ICD-10-CM | POA: Diagnosis not present

## 2019-02-06 DIAGNOSIS — Z0000 Encounter for general adult medical examination without abnormal findings: Secondary | ICD-10-CM | POA: Diagnosis not present

## 2019-02-06 LAB — BASIC METABOLIC PANEL
BUN: 36 — AB (ref 4–21)
Creatinine: 1.9 — AB (ref 0.6–1.3)
Glucose: 105
Potassium: 4.4 (ref 3.4–5.3)
Sodium: 137 (ref 137–147)

## 2019-02-06 LAB — HEMOGLOBIN A1C: Hemoglobin A1C: 6.1

## 2019-02-06 LAB — CBC AND DIFFERENTIAL
HCT: 42 (ref 41–53)
Hemoglobin: 13.8 (ref 13.5–17.5)
Platelets: 111 — AB (ref 150–399)
WBC: 4.4

## 2019-02-06 LAB — LIPID PANEL
Cholesterol: 196 (ref 0–200)
HDL: 109 — AB (ref 35–70)
LDL Cholesterol: 1
Triglycerides: 102 (ref 40–160)

## 2019-02-06 LAB — TSH: TSH: 1.46 (ref 0.41–5.90)

## 2019-02-06 LAB — HEPATIC FUNCTION PANEL
ALT: 26 (ref 10–40)
AST: 22 (ref 14–40)
Bilirubin, Total: 0.8

## 2019-02-06 LAB — PSA: PSA: 0.3

## 2019-02-13 DIAGNOSIS — Z6841 Body Mass Index (BMI) 40.0 and over, adult: Secondary | ICD-10-CM | POA: Diagnosis not present

## 2019-02-13 DIAGNOSIS — I10 Essential (primary) hypertension: Secondary | ICD-10-CM | POA: Diagnosis not present

## 2019-02-13 DIAGNOSIS — E785 Hyperlipidemia, unspecified: Secondary | ICD-10-CM | POA: Diagnosis not present

## 2019-02-13 DIAGNOSIS — E1122 Type 2 diabetes mellitus with diabetic chronic kidney disease: Secondary | ICD-10-CM | POA: Diagnosis not present

## 2019-02-13 DIAGNOSIS — Z0000 Encounter for general adult medical examination without abnormal findings: Secondary | ICD-10-CM | POA: Diagnosis not present

## 2019-02-13 DIAGNOSIS — N183 Chronic kidney disease, stage 3 (moderate): Secondary | ICD-10-CM | POA: Diagnosis not present

## 2019-03-05 DIAGNOSIS — Z23 Encounter for immunization: Secondary | ICD-10-CM | POA: Diagnosis not present

## 2019-03-07 ENCOUNTER — Ambulatory Visit (INDEPENDENT_AMBULATORY_CARE_PROVIDER_SITE_OTHER): Payer: Medicare HMO | Admitting: Family Medicine

## 2019-03-07 ENCOUNTER — Encounter (INDEPENDENT_AMBULATORY_CARE_PROVIDER_SITE_OTHER): Payer: Self-pay | Admitting: Family Medicine

## 2019-03-07 ENCOUNTER — Encounter: Payer: Self-pay | Admitting: Family Medicine

## 2019-03-07 ENCOUNTER — Other Ambulatory Visit: Payer: Self-pay

## 2019-03-07 VITALS — BP 134/77 | HR 72 | Temp 98.0°F | Ht 67.0 in | Wt 345.0 lb

## 2019-03-07 DIAGNOSIS — R5383 Other fatigue: Secondary | ICD-10-CM

## 2019-03-07 DIAGNOSIS — Z6841 Body Mass Index (BMI) 40.0 and over, adult: Secondary | ICD-10-CM | POA: Diagnosis not present

## 2019-03-07 DIAGNOSIS — E1165 Type 2 diabetes mellitus with hyperglycemia: Secondary | ICD-10-CM

## 2019-03-07 DIAGNOSIS — R0602 Shortness of breath: Secondary | ICD-10-CM | POA: Diagnosis not present

## 2019-03-07 DIAGNOSIS — I10 Essential (primary) hypertension: Secondary | ICD-10-CM

## 2019-03-07 DIAGNOSIS — G4733 Obstructive sleep apnea (adult) (pediatric): Secondary | ICD-10-CM | POA: Diagnosis not present

## 2019-03-07 DIAGNOSIS — Z1331 Encounter for screening for depression: Secondary | ICD-10-CM

## 2019-03-07 DIAGNOSIS — E559 Vitamin D deficiency, unspecified: Secondary | ICD-10-CM

## 2019-03-07 DIAGNOSIS — Z0289 Encounter for other administrative examinations: Secondary | ICD-10-CM

## 2019-03-08 LAB — T4, FREE: Free T4: 1.09 ng/dL (ref 0.82–1.77)

## 2019-03-08 LAB — CBC WITH DIFFERENTIAL
Basophils Absolute: 0 10*3/uL (ref 0.0–0.2)
Basos: 1 %
EOS (ABSOLUTE): 0.1 10*3/uL (ref 0.0–0.4)
Eos: 3 %
Hematocrit: 40.9 % (ref 37.5–51.0)
Hemoglobin: 13.9 g/dL (ref 13.0–17.7)
Immature Grans (Abs): 0 10*3/uL (ref 0.0–0.1)
Immature Granulocytes: 0 %
Lymphocytes Absolute: 1.4 10*3/uL (ref 0.7–3.1)
Lymphs: 33 %
MCH: 31.7 pg (ref 26.6–33.0)
MCHC: 34 g/dL (ref 31.5–35.7)
MCV: 93 fL (ref 79–97)
Monocytes Absolute: 0.7 10*3/uL (ref 0.1–0.9)
Monocytes: 16 %
Neutrophils Absolute: 2 10*3/uL (ref 1.4–7.0)
Neutrophils: 47 %
RBC: 4.39 x10E6/uL (ref 4.14–5.80)
RDW: 14.1 % (ref 11.6–15.4)
WBC: 4.2 10*3/uL (ref 3.4–10.8)

## 2019-03-08 LAB — INSULIN, RANDOM: INSULIN: 18.7 u[IU]/mL (ref 2.6–24.9)

## 2019-03-08 LAB — VITAMIN D 25 HYDROXY (VIT D DEFICIENCY, FRACTURES): Vit D, 25-Hydroxy: 54.1 ng/mL (ref 30.0–100.0)

## 2019-03-08 LAB — TSH: TSH: 1.57 u[IU]/mL (ref 0.450–4.500)

## 2019-03-08 LAB — T3: T3, Total: 126 ng/dL (ref 71–180)

## 2019-03-13 ENCOUNTER — Other Ambulatory Visit (INDEPENDENT_AMBULATORY_CARE_PROVIDER_SITE_OTHER): Payer: Self-pay

## 2019-03-13 NOTE — Progress Notes (Signed)
Office: [phone]  /  Fax: [phone]   Dear Dr. Rolena Gregory,   Thank you for referring Lance Hausen Sr. to our clinic. The following note includes my evaluation and treatment recommendations.  HPI:   Chief Complaint: OBESITY    Lance Hausen Sr. has been referred by Lance Gregory. Lance Infante, MD for consultation regarding his obesity and obesity related comorbidities.    Lance Bayless Sr. (MR# [id number]) is a 68 y.o. male who presents on 03/07/2019 for obesity evaluation and treatment. Current BMI is Body mass index is 54.03 kg/m. Lance Gregory has been struggling with his weight for many years and has been unsuccessful in either losing weight, maintaining weight loss, or reaching his healthy weight goal.     Lance Gregory states he skips breakfast daily. For lunch, he is doing 2 sausages, 1 egg, water, and grape juice (feels full). For Lance Gregory, he is doing seafood box, with 4 scallops, 3 shrimps, and fish with 1/2 cup of coleslaw, and 1 cup of fried okra (felt full). He states he is eating fried food 3-4 times a week.     Lance Gregory attended our information session and states he is currently in the action stage of change and ready to dedicate time achieving and maintaining a healthier weight. Lance Gregory is interested in becoming our patient and working on intensive lifestyle modifications including (but not limited to) diet, exercise and weight loss.    Lance Gregory states his desired weight loss is 145 lbs he started gaining weight at the age of 68 years old his heaviest weight ever was 350 lbs he skips meals frequently he is frequently drinking liquids with calories he frequently makes poor food choices he struggles with emotional eating    Fatigue Lance Gregory feels his energy is lower than it should be. This has worsened with weight gain and has not worsened recently. Lance Gregory admits to daytime somnolence and  denies waking up still tired. Patient is at risk for obstructive sleep apnea. Patent has a history of symptoms of  daytime fatigue. Patient generally gets 8 hours of sleep per night, and states they generally have generally restful sleep. Snoring is present. Apneic episodes are not present. Epworth Sleepiness Score is 18.  Dyspnea on exertion Lance Gregory notes increasing shortness of breath with exercising and seems to be worsening over time with weight gain. He notes getting out of breath sooner with activity than he used to. This has not gotten worse recently. EKG-T wave flattening, normal sinus rhythm at 69 BPM. Lance Gregory denies orthopnea.  Hypertension Lance Hausen Sr. is a 68 y.o. male with hypertension. Lance Gregory blood pressure is normally controlled. He is on losartan-hydrochlorothiazide, amlodipine, carvedilol. He denies chest pain. He is working weight loss to help control his blood pressure with the goal of decreasing his risk of heart attack and stroke.   Diabetes II with Hyperglycemia Lance Gregory has a diagnosis of diabetes type II. Lance Gregory states he is not checking his blood sugars at home. Last A1c was within normal limits per patient. His last eye exam was last year. He denies hypoglycemia. He has been working on intensive lifestyle modifications including diet, exercise, and weight loss to help control his blood glucose levels.  Vitamin D Deficiency Lance Gregory has a diagnosis of vitamin D deficiency. He is currently taking prescription strength Vit D. He notes fatigue and denies nausea, vomiting or muscle weakness.  Obstructive Sleep Apnea Lance Gregory states he was tested for obstructive sleep apnea approximately 30 years ago.  Depression Screen Lance Gregory's Food and Mood (modified PHQ-9) score  was  Depression screen PHQ 2/9 03/07/2019  Decreased Interest 2  Down, Depressed, Hopeless 0  PHQ - 2 Score 2  Altered sleeping 1  Tired, decreased energy 3  Change in appetite 0  Feeling bad or failure about yourself  0  Trouble concentrating 1  Moving slowly or fidgety/restless 0  Suicidal thoughts 0  PHQ-9 Score 7   Difficult doing work/chores Not difficult at all    ASSESSMENT AND PLAN:  Other fatigue - Plan: EKG 12-Lead, CBC With Differential, TSH, T3, T4, free  SOB (shortness of breath) on exertion  Essential hypertension  Type 2 diabetes mellitus with hyperglycemia, without long-term current use of insulin (HCC) - Plan: Insulin, random  Vitamin D deficiency - Plan: VITAMIN D 25 Hydroxy (Vit-D Deficiency, Fractures)  OSA (obstructive sleep apnea)  Depression screening  Class 3 severe obesity with serious comorbidity and body mass index (BMI) of 50.0 to 59.9 in adult, unspecified obesity type (HCC)  PLAN:  Fatigue Lance Gregory was informed that his fatigue may be related to obesity, depression or many other causes. Labs will be ordered, and in the meanwhile Lance Gregory has agreed to work on diet, exercise and weight loss to help with fatigue. Proper sleep hygiene was discussed including the need for 7-8 hours of quality sleep each night. A sleep study was not ordered based on symptoms and Epworth score.  Dyspnea on exertion Lance Gregory's shortness of breath appears to be obesity related and exercise induced. He has agreed to work on weight loss and gradually increase exercise to treat his exercise induced shortness of breath. If Lance Gregory follows our instructions and loses weight without improvement of his shortness of breath, we will plan to refer to pulmonology. We will monitor this condition regularly. Lance Gregory agrees to this plan.  Hypertension We discussed sodium restriction, working on healthy weight loss, and a regular exercise program as the means to achieve improved blood pressure control. Lance Gregory agreed with this plan and agreed to follow up as directed. We will continue to monitor his blood pressure as well as his progress with the above lifestyle modifications. Lance Gregory will continue his medications as prescribed and will watch for signs of hypotension as he continues his lifestyle modifications. EKG was  done, and we will check labs today. Lance Gregory agrees to follow up with our clinic in 2 weeks.  Diabetes II with Hyperglycemia Lance Gregory has been given extensive diabetes education by myself today including ideal fasting and post-prandial blood glucose readings, individual ideal Hgb A1c goals and hypoglycemia prevention. We discussed the importance of good blood sugar control to decrease the likelihood of diabetic complications such as nephropathy, neuropathy, limb loss, blindness, coronary artery disease, and death. We discussed the importance of intensive lifestyle modification including diet, exercise and weight loss as the first line treatment for diabetes. We will obtain labs from Dr. Maudie Mercury and check insulin level today; hypoglycemia protocol was discussed. We will send continuous Glucose Monitoring kit to the pharmacy. If his blood sugars drop below 70, he is to cut glipizide to 2.5 mg, if blood sugars drop after that then he is to stop glipizide. Chue agrees to follow up with our clinic in 2 weeks.  Vitamin D Deficiency Jefrey was informed that low vitamin D levels contributes to fatigue and are associated with obesity, breast, and colon cancer. Lance Gregory agrees to continue taking prescription Vit D and will follow up for routine testing of vitamin D, at least 2-3 times per year. He was informed of the risk of over-replacement  of vitamin D and agrees to not increase his dose unless he discusses this with [place] first. We will check Vit D level today. Lance Gregory agrees to follow up with our clinic in 2 weeks.  Obstructive Sleep Apnea Lance Gregory wishes to defer evaluation of obstructive sleep apnea.  Depression Screen Lance Gregory had a mildly positive depression screening. Depression is commonly associated with obesity and often results in emotional eating behaviors. We will monitor this closely and work on CBT to help improve the non-hunger eating patterns. Referral to Psychology may be required if no improvement is seen as  he continues in our clinic.  Obesity Lance Gregory is currently in the action stage of change and his goal is to continue with weight loss efforts. I recommend Estes begin the structured treatment plan as follows:  He has agreed to follow the Category 3 plan Lance Gregory has been instructed to eventually work up to a goal of 150 minutes of combined cardio and strengthening exercise per week for weight loss and overall health benefits. We discussed the following Behavioral Modification Strategies today: increasing lean protein intake, increasing vegetables and work on meal planning and easy cooking plans, keeping healthy foods in the home, and planning for success   He was informed of the importance of frequent follow up visits to maximize his success with intensive lifestyle modifications for his multiple health conditions. He was informed we would discuss his lab results at his next visit unless there is a critical issue that needs to be addressed sooner. Braheem agreed to keep his next visit at the agreed upon time to discuss these results.  ALLERGIES: Allergies  Allergen Reactions  . Vicodin [Hydrocodone-Acetaminophen] Other (See Comments)    Remembers he didn't like  . Codeine Other (See Comments)    REACTION WAS YEARS AGO-PT DOES NOT REMEMBER WHAT THE REACTION WAS    MEDICATIONS: Current Outpatient Medications on File Prior to Visit  Medication Sig Dispense Refill  . allopurinol (ZYLOPRIM) 300 MG tablet Take 300 mg by mouth daily.    Marland Kitchen amLODipine (NORVASC) 10 MG tablet Take 10 mg by mouth every morning.    Marland Kitchen aspirin EC 81 MG tablet Take 81 mg by mouth at bedtime.    . carvedilol (COREG) 3.125 MG tablet Take 3.125 mg by mouth 2 (two) times daily with a meal.     . clotrimazole-betamethasone (LOTRISONE) cream Apply 1 application topically 2 (two) times daily as needed (for irritation).    . Colchicine 0.6 MG CAPS Take 0.6 mg by mouth every other day.  1  . doxazosin (CARDURA) 4 MG tablet Take 4  mg by mouth at bedtime.    . empagliflozin (JARDIANCE) 25 MG TABS tablet Take 25 mg by mouth daily.    . ergocalciferol (VITAMIN D2) 50000 units capsule Take 50,000 Units by mouth once a week.    . furosemide (LASIX) 40 MG tablet Take 40 mg by mouth daily as needed for fluid.    Marland Kitchen glipiZIDE (GLUCOTROL) 5 MG tablet Take 5 mg by mouth 2 (two) times daily before a meal.    . losartan-hydrochlorothiazide (HYZAAR) 100-25 MG tablet Take 1 tablet by mouth daily.     . magnesium gluconate (MAGONATE) 500 MG tablet Take 500 mg by mouth 3 (three) times a week.    . Methylcellulose, Laxative, (HM FIBER) 500 MG TABS Take 500 mg by mouth daily.    . Multiple Vitamin (MULTIVITAMIN WITH MINERALS) TABS Take 1 tablet by mouth daily.    . pravastatin (PRAVACHOL)  40 MG tablet Take 40 mg by mouth at bedtime.    . predniSONE (DELTASONE) 10 MG tablet Take 10 mg by mouth daily with breakfast.    . VICTOZA 18 MG/3ML SOPN Inject 0.6 mg into the skin daily.   0  . vitamin B-12 (CYANOCOBALAMIN) 500 MCG tablet Take 500 mcg by mouth daily.     No current facility-administered medications on file prior to visit.     PAST MEDICAL HISTORY: Past Medical History:  Diagnosis Date  . Arthritis   . Back pain   . Diabetes mellitus without complication (HCC)    ORAL MEDS-NO INSULIN  . GERD (gastroesophageal reflux disease)    OCCAS AFTER EATING CERTAIN FOODS  . Hyperlipidemia   . Hypertension   . Joint pain   . Rheumatoid arthritis ([place])   . Shortness of breath   . Sleep apnea   . Ureteral stone    LEFT  --DISCOMFORT    PAST SURGICAL HISTORY: Past Surgical History:  Procedure Laterality Date  . ANAL FISSURE REPAIR    . COLONOSCOPY WITH PROPOFOL N/A 03/02/2018   Procedure: COLONOSCOPY WITH PROPOFOL;  Surgeon: Carol Ada, MD;  Location: [facility];  Service: Endoscopy;  Laterality: N/A;  . CYSTOSCOPY WITH URETEROSCOPY  05/16/2012   Procedure: CYSTOSCOPY WITH URETEROSCOPY;  Surgeon: Molli Hazard, MD;   Location: [facility];  Service: Urology;  Laterality: Left;  left ureteral stent placement  . HOLMIUM LASER APPLICATION  89/21/1942   Procedure: HOLMIUM LASER APPLICATION;  Surgeon: Molli Hazard, MD;  Location: [facility];  Service: Urology;  Laterality: Left;  . KNEE ARTHROSCOPY     SEVERAL  SURGERIES BOTH KNEES  . POLYPECTOMY  03/02/2018   Procedure: POLYPECTOMY;  Surgeon: Carol Ada, MD;  Location: [facility];  Service: Endoscopy;;  . SUBMUCOSAL INJECTION  03/02/2018   Procedure: SUBMUCOSAL INJECTION saline;  Surgeon: Carol Ada, MD;  Location: [facility];  Service: Endoscopy;;  . VASCECTOMY      SOCIAL HISTORY: Social History   Tobacco Use  . Smoking status: Former Research scientist (life sciences)  . Smokeless tobacco: Never Used  . Tobacco comment: QUIT SMOKING MANY YRS AGO  Substance Use Topics  . Alcohol use: Yes    Comment: OCCAS  . Drug use: No    FAMILY HISTORY: Family History  Problem Relation Age of Onset  . Hypertension Mother   . Obesity Father     ROS: Review of Systems  Constitutional: Positive for malaise/fatigue. Negative for weight loss.  HENT:       + Dry mouth  Respiratory: Positive for shortness of breath (with exertion) and wheezing.   Cardiovascular: Negative for chest pain and orthopnea.       + Calf/leg pain with walking  Gastrointestinal: Negative for nausea and vomiting.  Musculoskeletal: Positive for back pain.       Negative muscle weakness + Muscle or joint pain + Muscle stiffness + Red or swollen joints  Skin: Positive for itching.       + Dryness  Endo/Heme/Allergies:       Negative hypoglycemia    PHYSICAL EXAM: Blood pressure 134/77, pulse 72, temperature 98 F (36.7 C), temperature source Oral, height _0  (1.702 m), weight (!) 345 lb (156.5 kg), SpO2 94 %. Body mass index is 54.03 kg/m. Physical Exam Vitals signs reviewed.  Constitutional:      Appearance: Normal appearance. He is obese.  HENT:     Head: Normocephalic and atraumatic.      Nose: Nose normal.  Eyes:     General: No scleral icterus.    Extraocular Movements: Extraocular movements intact.  Neck:     Musculoskeletal: Normal range of motion and neck supple.     Comments: No thyromegaly present Cardiovascular:     Rate and Rhythm: Normal rate and regular rhythm.     Pulses: Normal pulses.     Heart sounds: Normal heart sounds.  Pulmonary:     Effort: Pulmonary effort is normal. No respiratory distress.     Breath sounds: Normal breath sounds.  Abdominal:     Palpations: Abdomen is soft.     Tenderness: There is no abdominal tenderness.     Comments: + Obesity  Musculoskeletal: Normal range of motion.     Right lower leg: No edema.     Left lower leg: No edema.  Skin:    General: Skin is warm and dry.  Neurological:     Mental Status: He is alert and oriented to person, place, and time.     Coordination: Coordination normal.  Psychiatric:        Mood and Affect: Mood normal.        Behavior: Behavior normal.     RECENT LABS AND TESTS: BMET    Component Value Date/Time   NA 136 05/15/2012 1115   K 4.4 05/15/2012 1115   CL 100 05/15/2012 1115   CO2 23 05/15/2012 1115   GLUCOSE 185 (H) 05/15/2012 1115   BUN 36 (H) 05/15/2012 1115   CREATININE 2.12 (H) 05/15/2012 1115   CALCIUM 10.1 05/15/2012 1115   GFRNONAA 32 (L) 05/15/2012 1115   GFRAA 37 (L) 05/15/2012 1115   No results found for: HGBA1C Lab Results  Component Value Date   INSULIN 18.7 03/07/2019   CBC    Component Value Date/Time   WBC 4.2 03/07/2019 1205   WBC 5.1 05/15/2012 1115   RBC 4.39 03/07/2019 1205   RBC 4.11 (L) 05/15/2012 1115   HGB 13.9 03/07/2019 1205   HCT 40.9 03/07/2019 1205   PLT 212 05/15/2012 1115   MCV 93 03/07/2019 1205   MCH 31.7 03/07/2019 1205   MCH 30.2 05/15/2012 1115   MCHC 34.0 03/07/2019 1205   MCHC 33.4 05/15/2012 1115   RDW 14.1 03/07/2019 1205   LYMPHSABS 1.4 03/07/2019 1205   EOSABS 0.1 03/07/2019 1205   BASOSABS 0.0 03/07/2019 1205    Iron/TIBC/Ferritin/ %Sat No results found for: IRON, TIBC, FERRITIN, IRONPCTSAT Lipid Panel  No results found for: CHOL, TRIG, HDL, CHOLHDL, VLDL, LDLCALC, LDLDIRECT Hepatic Function Panel  No results found for: PROT, ALBUMIN, AST, ALT, ALKPHOS, BILITOT, BILIDIR, IBILI    Component Value Date/Time   TSH 1.570 03/07/2019 1205    ECG  shows NSR with a rate of 69 BPM INDIRECT CALORIMETER done today shows a VO2 of 315 and a REE of 2193.  His calculated basal metabolic rate is 9381 thus his basal metabolic rate is worse than expected.       OBESITY BEHAVIORAL INTERVENTION VISIT  Today's visit was # 1   Starting weight: 345 lbs Starting date: 03/07/2019 Today's weight : 345 lbs  Today's date: 03/07/2019 Total lbs lost to date: 0 At least 15 minutes were spent on discussing the following behavioral intervention visit.   ASK: We discussed the diagnosis of obesity with Lance Hausen Sr. today and Eura agreed to give [place] permission to discuss obesity behavioral modification therapy today.  ASSESS: Adelbert has the diagnosis of obesity and his BMI today is 54.02  Gery is in the action stage of change   ADVISE: Jarmarcus was educated on the multiple health risks of obesity as well as the benefit of weight loss to improve his health. He was advised of the need for long term treatment and the importance of lifestyle modifications to improve his current health and to decrease his risk of future health problems.  AGREE: Multiple dietary modification options and treatment options were discussed and  Hale agreed to follow the recommendations documented in the above note.  ARRANGE: Joel was educated on the importance of frequent visits to treat obesity as outlined per CMS and USPSTF guidelines and agreed to schedule his next follow up appointment today.  I, Trixie Dredge, am acting as transcriptionist for Ilene Qua, MD   I have reviewed the above documentation for accuracy  and completeness, and I agree with the above. - Ilene Qua, MD

## 2019-03-19 DIAGNOSIS — Z87442 Personal history of urinary calculi: Secondary | ICD-10-CM | POA: Diagnosis not present

## 2019-03-19 DIAGNOSIS — I129 Hypertensive chronic kidney disease with stage 1 through stage 4 chronic kidney disease, or unspecified chronic kidney disease: Secondary | ICD-10-CM | POA: Diagnosis not present

## 2019-03-19 DIAGNOSIS — N183 Chronic kidney disease, stage 3 (moderate): Secondary | ICD-10-CM | POA: Diagnosis not present

## 2019-03-19 DIAGNOSIS — R600 Localized edema: Secondary | ICD-10-CM | POA: Diagnosis not present

## 2019-03-21 ENCOUNTER — Other Ambulatory Visit: Payer: Self-pay

## 2019-03-21 ENCOUNTER — Encounter (INDEPENDENT_AMBULATORY_CARE_PROVIDER_SITE_OTHER): Payer: Self-pay | Admitting: Family Medicine

## 2019-03-21 ENCOUNTER — Ambulatory Visit (INDEPENDENT_AMBULATORY_CARE_PROVIDER_SITE_OTHER): Payer: Medicare HMO | Admitting: Family Medicine

## 2019-03-21 VITALS — BP 126/76 | HR 87 | Temp 98.3°F | Ht 67.0 in | Wt 346.0 lb

## 2019-03-21 DIAGNOSIS — Z6841 Body Mass Index (BMI) 40.0 and over, adult: Secondary | ICD-10-CM | POA: Diagnosis not present

## 2019-03-21 DIAGNOSIS — E1165 Type 2 diabetes mellitus with hyperglycemia: Secondary | ICD-10-CM | POA: Diagnosis not present

## 2019-03-21 DIAGNOSIS — E559 Vitamin D deficiency, unspecified: Secondary | ICD-10-CM

## 2019-03-21 MED ORDER — FREESTYLE LIBRE 2 READER SYSTM DEVI: 1.0000 | Freq: Every day | 0 refills | Status: AC

## 2019-03-21 MED ORDER — FREESTYLE LIBRE 14 DAY SENSOR MISC: 1.0000 | Freq: Every day | 0 refills | Status: AC

## 2019-03-22 ENCOUNTER — Other Ambulatory Visit: Payer: Self-pay | Admitting: Nephrology

## 2019-03-22 DIAGNOSIS — N183 Chronic kidney disease, stage 3 unspecified: Secondary | ICD-10-CM

## 2019-03-26 NOTE — Progress Notes (Signed)
Office: [phone]  /  Fax: [phone]   HPI:   Chief Complaint: OBESITY Lance Gregory is here to discuss his progress with his obesity treatment plan. He is on the Category 3 plan and is following his eating plan approximately 0 % of the time. He states he is exercising 0 minutes 0 times per week. Lance Gregory had 2 unexpected deaths in the family, and that has kept him busy with planning arrangements and attending services. He was unable to follow the plan. She saw his Nephrologist yesterday. He plans to start the meal plan in 4 days. His weight is (!) 346 lb (156.9 kg) today and has gained 1 lb since his last visit. He has lost 0 lbs since starting treatment with [place].  Diabetes II with Hyperglycemia Lance Gregory has a diagnosis of diabetes type II. Lance Gregory is on Jardiance, glucotrol, statin, and Vitoza. He denies hypoglycemia. Last A1c was 6.10. He has been working on intensive lifestyle modifications including diet, exercise, and weight loss to help control his blood glucose levels.  Vitamin D Deficiency Lance Gregory has a diagnosis of vitamin D deficiency. He is currently taking prescription Vit D. Last Vit D level was of 54.1. He denies nausea, vomiting or muscle weakness.  ASSESSMENT AND PLAN:  Type 2 diabetes mellitus with hyperglycemia, without long-term current use of insulin ([name]) - Plan: Continuous Blood Gluc Receiver (FREESTYLE LIBRE 2 READER SYSTM) DEVI, Continuous Blood Gluc Sensor (FREESTYLE LIBRE 14 DAY SENSOR) MISC  Vitamin D deficiency  Class 3 severe obesity with serious comorbidity and body mass index (BMI) of 50.0 to 59.9 in adult, unspecified obesity type ([name])  PLAN:  Diabetes II with Hyperglycemia Lance Gregory has been given extensive diabetes education by myself today including ideal fasting and post-prandial blood glucose readings, individual ideal Hgb A1c goals and hypoglycemia prevention. We discussed the importance of good blood sugar control to decrease the likelihood of diabetic  complications such as nephropathy, neuropathy, limb loss, blindness, coronary artery disease, and death. We discussed the importance of intensive lifestyle modification including diet, exercise and weight loss as the first line treatment for diabetes. Lance Gregory agrees to continue his diabetes medications and we will send Continuous Glucose Monitoring Kit to the pharmacy. Lance Gregory agrees to follow up with our clinic in 2 weeks.  Vitamin D Deficiency [name] was informed that low vitamin D levels contributes to fatigue and are associated with obesity, breast, and colon cancer. Lance Gregory agrees to stop prescription Vit D, and start multivitamins. He will follow up for routine testing of vitamin D, at least 2-3 times per year. He was informed of the risk of over-replacement of vitamin D and agrees to not increase his dose unless he discusses this with [place] first. [name] agrees to follow up with our clinic in 2 weeks.  Obesity Lance Gregory is currently in the action stage of change. As such, his goal is to continue with weight loss efforts He has agreed to follow the Category 3 plan Lance Gregory has been instructed to work up to a goal of 150 minutes of combined cardio and strengthening exercise per week for weight loss and overall health benefits. We discussed the following Behavioral Modification Strategies today: increasing lean protein intake, increasing vegetables and work on meal planning and easy cooking plans, keeping healthy foods in the home, better snacking choices, and planning for success   Lance Gregory has agreed to follow up with our clinic in 2 weeks. He was informed of the importance of frequent follow up visits to maximize his success  with intensive lifestyle modifications for his multiple health conditions.  ALLERGIES: Allergies  Allergen Reactions   Vicodin [Hydrocodone-Acetaminophen] Other (See Comments)    Remembers he didn't like   Codeine Other (See Comments)    REACTION WAS YEARS AGO-PT DOES NOT  REMEMBER WHAT THE REACTION WAS    MEDICATIONS: Current Outpatient Medications on File Prior to Visit  Medication Sig Dispense Refill   allopurinol (ZYLOPRIM) 300 MG tablet Take 300 mg by mouth daily.     amLODipine (NORVASC) 10 MG tablet Take 10 mg by mouth every morning.     aspirin EC 81 MG tablet Take 81 mg by mouth at bedtime.     carvedilol (COREG) 3.125 MG tablet Take 3.125 mg by mouth 2 (two) times daily with a meal.      clotrimazole-betamethasone (LOTRISONE) cream Apply 1 application topically 2 (two) times daily as needed (for irritation).     Colchicine 0.6 MG CAPS Take 0.6 mg by mouth every other day.  1   doxazosin (CARDURA) 4 MG tablet Take 4 mg by mouth at bedtime.     empagliflozin (JARDIANCE) 25 MG TABS tablet Take 25 mg by mouth daily.     ergocalciferol (VITAMIN D2) 50000 units capsule Take 50,000 Units by mouth once a week.     furosemide (LASIX) 40 MG tablet Take 40 mg by mouth daily as needed for fluid.     glipiZIDE (GLUCOTROL) 5 MG tablet Take 5 mg by mouth 2 (two) times daily before a meal.     losartan-hydrochlorothiazide (HYZAAR) 100-25 MG tablet Take 1 tablet by mouth daily.      magnesium gluconate (MAGONATE) 500 MG tablet Take 500 mg by mouth 3 (three) times a week.     Methylcellulose, Laxative, (HM FIBER) 500 MG TABS Take 500 mg by mouth daily.     Multiple Vitamin (MULTIVITAMIN WITH MINERALS) TABS Take 1 tablet by mouth daily.     pravastatin (PRAVACHOL) 40 MG tablet Take 40 mg by mouth at bedtime.     predniSONE (DELTASONE) 10 MG tablet Take 10 mg by mouth daily with breakfast.     VICTOZA 18 MG/3ML SOPN Inject 0.6 mg into the skin daily.   0   vitamin B-12 (CYANOCOBALAMIN) 500 MCG tablet Take 500 mcg by mouth daily.     No current facility-administered medications on file prior to visit.     PAST MEDICAL HISTORY: Past Medical History:  Diagnosis Date   Arthritis    Back pain    Diabetes mellitus without complication (HCC)     ORAL MEDS-NO INSULIN   GERD (gastroesophageal reflux disease)    OCCAS AFTER EATING CERTAIN FOODS   Hyperlipidemia    Hypertension    Joint pain    Rheumatoid arthritis ([place])    Shortness of breath    Sleep apnea    Ureteral stone    LEFT  --DISCOMFORT    PAST SURGICAL HISTORY: Past Surgical History:  Procedure Laterality Date   ANAL FISSURE REPAIR     COLONOSCOPY WITH PROPOFOL N/A 03/02/2018   Procedure: COLONOSCOPY WITH PROPOFOL;  Surgeon: Carol Ada, MD;  Location: [facility];  Service: Endoscopy;  Laterality: N/A;   CYSTOSCOPY WITH URETEROSCOPY  05/16/2012   Procedure: CYSTOSCOPY WITH URETEROSCOPY;  Surgeon: Molli Hazard, MD;  Location: [facility];  Service: Urology;  Laterality: Left;  left ureteral stent placement   HOLMIUM LASER APPLICATION  92/07/69   Procedure: HOLMIUM LASER APPLICATION;  Surgeon: Molli Hazard, MD;  Location: [facility];  Service: Urology;  Laterality: Left;   KNEE ARTHROSCOPY     SEVERAL  SURGERIES BOTH KNEES   POLYPECTOMY  03/02/2018   Procedure: POLYPECTOMY;  Surgeon: Carol Ada, MD;  Location: [facility];  Service: Endoscopy;;   SUBMUCOSAL INJECTION  03/02/2018   Procedure: SUBMUCOSAL INJECTION saline;  Surgeon: Carol Ada, MD;  Location: [facility];  Service: Endoscopy;;   VASCECTOMY      SOCIAL HISTORY: Social History   Tobacco Use   Smoking status: Former Smoker   Smokeless tobacco: Never Used   Tobacco comment: QUIT SMOKING MANY YRS AGO  Substance Use Topics   Alcohol use: Yes    Comment: OCCAS   Drug use: No    FAMILY HISTORY: Family History  Problem Relation Age of Onset   Hypertension Mother    Obesity Father     ROS: Review of Systems  Constitutional: Negative for weight loss.  Gastrointestinal: Negative for nausea and vomiting.  Musculoskeletal:       Negative muscle weakness  Endo/Heme/Allergies:       Negative hypoglycemia    PHYSICAL EXAM: Blood pressure 126/76,  pulse 87, temperature 98.3 F (36.8 C), temperature source Oral, height 5' 7" (1.702 m), weight (!) 346 lb (156.9 kg), SpO2 94 %. Body mass index is 54.19 kg/m. Physical Exam Vitals signs reviewed.  Constitutional:      Appearance: Normal appearance. He is obese.  Cardiovascular:     Rate and Rhythm: Normal rate.     Pulses: Normal pulses.  Pulmonary:     Effort: Pulmonary effort is normal.     Breath sounds: Normal breath sounds.  Musculoskeletal: Normal range of motion.  Skin:    General: Skin is warm and dry.  Neurological:     Mental Status: He is alert and oriented to person, place, and time.  Psychiatric:        Mood and Affect: Mood normal.        Behavior: Behavior normal.     RECENT LABS AND TESTS: BMET    Component Value Date/Time   NA 137 02/06/2019   K 4.4 02/06/2019   CL 100 05/15/2012 1115   CO2 23 05/15/2012 1115   GLUCOSE 185 (H) 05/15/2012 1115   BUN 36 (A) 02/06/2019   CREATININE 1.9 (A) 02/06/2019   CREATININE 2.12 (H) 05/15/2012 1115   CALCIUM 10.1 05/15/2012 1115   GFRNONAA 32 (L) 05/15/2012 1115   GFRAA 37 (L) 05/15/2012 1115   Lab Results  Component Value Date   HGBA1C 6.10 02/06/2019   Lab Results  Component Value Date   INSULIN 18.7 03/07/2019   CBC    Component Value Date/Time   WBC 4.2 03/07/2019 1205   WBC 5.1 05/15/2012 1115   RBC 4.39 03/07/2019 1205   RBC 4.11 (L) 05/15/2012 1115   HGB 13.9 03/07/2019 1205   HCT 40.9 03/07/2019 1205   PLT 111 (A) 02/06/2019   MCV 93 03/07/2019 1205   MCH 31.7 03/07/2019 1205   MCH 30.2 05/15/2012 1115   MCHC 34.0 03/07/2019 1205   MCHC 33.4 05/15/2012 1115   RDW 14.1 03/07/2019 1205   LYMPHSABS 1.4 03/07/2019 1205   EOSABS 0.1 03/07/2019 1205   BASOSABS 0.0 03/07/2019 1205   Iron/TIBC/Ferritin/ %Sat No results found for: IRON, TIBC, FERRITIN, IRONPCTSAT Lipid Panel     Component Value Date/Time   CHOL 196 02/06/2019   TRIG 102 02/06/2019   HDL 109 (A) 02/06/2019   LDLCALC 1  02/06/2019   Hepatic  Function Panel     Component Value Date/Time   AST 22 02/06/2019   ALT 26 02/06/2019      Component Value Date/Time   TSH 1.570 03/07/2019 1205   TSH 1.46 02/06/2019      OBESITY BEHAVIORAL INTERVENTION VISIT  Today's visit was # 2   Starting weight: 345 lbs Starting date: 03/07/2019 Today's weight : 346 lbs Today's date: 03/21/2019 Total lbs lost to date: 0 At least 15 minutes were spent on discussing the following behavioral intervention visit.   ASK: We discussed the diagnosis of obesity with [name] today and Kahron agreed to give [place] permission to discuss obesity behavioral modification therapy today.  ASSESS: Romualdo has the diagnosis of obesity and his BMI today is 54.18 Dayron is in the action stage of change   ADVISE: Devesh was educated on the multiple health risks of obesity as well as the benefit of weight loss to improve his health. He was advised of the need for long term treatment and the importance of lifestyle modifications to improve his current health and to decrease his risk of future health problems.  AGREE: Multiple dietary modification options and treatment options were discussed and  Aarish agreed to follow the recommendations documented in the above note.  ARRANGE: Mcclain was educated on the importance of frequent visits to treat obesity as outlined per CMS and USPSTF guidelines and agreed to schedule his next follow up appointment today.  I, Trixie Dredge, am acting as transcriptionist for Ilene Qua, MD  I have reviewed the above documentation for accuracy and completeness, and I agree with the above. - Ilene Qua, MD

## 2019-03-27 ENCOUNTER — Ambulatory Visit
Admission: RE | Admit: 2019-03-27 | Discharge: 2019-03-27 | Disposition: A | Discharge status: Home / Self Care | Payer: Medicare HMO | Source: Ambulatory Visit | Attending: Nephrology | Admitting: Nephrology

## 2019-03-27 DIAGNOSIS — N189 Chronic kidney disease, unspecified: Secondary | ICD-10-CM | POA: Diagnosis not present

## 2019-03-27 DIAGNOSIS — N183 Chronic kidney disease, stage 3 unspecified: Secondary | ICD-10-CM

## 2019-03-28 ENCOUNTER — Telehealth (INDEPENDENT_AMBULATORY_CARE_PROVIDER_SITE_OTHER): Payer: Self-pay

## 2019-03-28 NOTE — Telephone Encounter (Signed)
Pt called in asking about fasting and post prandial blood sugar ranges. Per Dr. Adair Patter, his goal for FBS is <100 to 125 and post prandial goal BS is 130-170. If BS's start going too low, he is to call us. Pt voiced understanding. Lance Gregory

## 2019-04-08 ENCOUNTER — Encounter (INDEPENDENT_AMBULATORY_CARE_PROVIDER_SITE_OTHER): Payer: Self-pay | Admitting: Family Medicine

## 2019-04-08 ENCOUNTER — Other Ambulatory Visit: Payer: Self-pay

## 2019-04-08 ENCOUNTER — Ambulatory Visit (INDEPENDENT_AMBULATORY_CARE_PROVIDER_SITE_OTHER): Payer: Medicare HMO | Admitting: Family Medicine

## 2019-04-08 VITALS — BP 127/74 | HR 75 | Temp 97.8°F | Ht 67.0 in | Wt 327.0 lb

## 2019-04-08 DIAGNOSIS — Z6841 Body Mass Index (BMI) 40.0 and over, adult: Secondary | ICD-10-CM | POA: Diagnosis not present

## 2019-04-08 DIAGNOSIS — I10 Essential (primary) hypertension: Secondary | ICD-10-CM

## 2019-04-08 DIAGNOSIS — E1165 Type 2 diabetes mellitus with hyperglycemia: Secondary | ICD-10-CM | POA: Diagnosis not present

## 2019-04-09 NOTE — Progress Notes (Signed)
Office: [phone]  /  Fax: [phone]   HPI:   Chief Complaint: OBESITY Lance Gregory is here to discuss his progress with his obesity treatment plan. He is on the Category 3 plan and is following his eating plan approximately 90 % of the time. He states he is walking for 10 minutes 3 times per week. Lance Gregory has been trying to stick with the meal plan, and had to make substitutions for [employer]. He is not doing cottage cheese secondary to not having lactose free option. He stopped eating fried food and stopped drinking alcohol in the past few weeks.  His weight is (!) 327 lb (148.3 kg) today and has had a weight loss of 19 pounds over a period of 2 to 3 weeks since his last visit. He has lost 18 lbs since starting treatment with [place].  Diabetes II with Hyperglycemia Lance Gregory has a diagnosis of diabetes type II. Lance Gregory has been taking glipizide BID previously. He denies feelings of hypoglycemia. Last A1c was 6.10. He has been working on intensive lifestyle modifications including diet, exercise, and weight loss to help control his blood glucose levels.  Hypertension Lance Hausen Sr. is a 68 y.o. male with hypertension. Lance Gregory is on Hyzaar. He denies chest pain, chest pressure, or headaches. He is working on weight loss to help control his blood pressure with the goal of decreasing his risk of heart attack and stroke.   ASSESSMENT AND PLAN:  Type 2 diabetes mellitus with hyperglycemia, without long-term current use of insulin (HCC)  Essential hypertension  Class 3 severe obesity with serious comorbidity and body mass index (BMI) of 50.0 to 59.9 in adult, unspecified obesity type ([place])  PLAN:  Diabetes II with Hyperglycemia [name] has been given extensive diabetes education by myself today including ideal fasting and post-prandial blood glucose readings, individual ideal Hgb A1c goals and hypoglycemia prevention. We discussed the importance of good blood sugar control to decrease the  likelihood of diabetic complications such as nephropathy, neuropathy, limb loss, blindness, coronary artery disease, and death. We discussed the importance of intensive lifestyle modification including diet, exercise and weight loss as the first line treatment for diabetes. Lance Gregory agrees to continue taking daily glipizide, and will stop at next appointment. Lance Gregory agrees to follow up with our clinic in 2 weeks.  Hypertension We discussed sodium restriction, working on healthy weight loss, and a regular exercise program as the means to achieve improved blood pressure control. Lance Gregory agreed with this plan and agreed to follow up as directed. We will continue to monitor his blood pressure as well as his progress with the above lifestyle modifications. Lance Gregory agrees to continue his current medications, no refill needed and will watch for signs of hypotension as he continues his lifestyle modifications. Lance Gregory agrees to follow up with our clinic in 2 weeks.  I spent > than 50% of the 15 minute visit on counseling as documented in the note.  Obesity Lance Gregory is currently in the action stage of change. As such, his goal is to continue with weight loss efforts He has agreed to follow the Category 3 plan Lance Gregory has been instructed to work up to a goal of 150 minutes of combined cardio and strengthening exercise per week for weight loss and overall health benefits. We discussed the following Behavioral Modification Strategies today: increasing lean protein intake, increasing vegetables, work on meal planning and easy cooking plans, and planning for success   Lance Gregory has agreed to follow up with our clinic in 2  weeks. He was informed of the importance of frequent follow up visits to maximize his success with intensive lifestyle modifications for his multiple health conditions.  ALLERGIES: Allergies  Allergen Reactions   Vicodin [Hydrocodone-Acetaminophen] Other (See Comments)    Remembers he didn't like    Codeine Other (See Comments)    REACTION WAS YEARS AGO-PT DOES NOT REMEMBER WHAT THE REACTION WAS    MEDICATIONS: Current Outpatient Medications on File Prior to Visit  Medication Sig Dispense Refill   allopurinol (ZYLOPRIM) 300 MG tablet Take 300 mg by mouth daily.     amLODipine (NORVASC) 10 MG tablet Take 10 mg by mouth every morning.     aspirin EC 81 MG tablet Take 81 mg by mouth at bedtime.     carvedilol (COREG) 3.125 MG tablet Take 3.125 mg by mouth 2 (two) times daily with a meal.      Colchicine 0.6 MG CAPS Take 0.6 mg by mouth every other day.  1   Continuous Blood Gluc Receiver (FREESTYLE LIBRE 2 READER SYSTM) DEVI 1 each by Does not apply route daily. As directed 1 Device 0   Continuous Blood Gluc Sensor (FREESTYLE LIBRE 14 DAY SENSOR) MISC 1 each by Does not apply route daily. Apply to skin for 14 days then remove 2 each 0   doxazosin (CARDURA) 4 MG tablet Take 4 mg by mouth at bedtime.     empagliflozin (JARDIANCE) 25 MG TABS tablet Take 25 mg by mouth daily.     furosemide (LASIX) 40 MG tablet Take 40 mg by mouth daily as needed for fluid.     glipiZIDE (GLUCOTROL) 5 MG tablet Take 5 mg by mouth 2 (two) times daily before a meal.     losartan-hydrochlorothiazide (HYZAAR) 100-25 MG tablet Take 1 tablet by mouth daily.      magnesium gluconate (MAGONATE) 500 MG tablet Take 500 mg by mouth 3 (three) times a week.     Methylcellulose, Laxative, (HM FIBER) 500 MG TABS Take 500 mg by mouth daily.     Multiple Vitamin (MULTIVITAMIN WITH MINERALS) TABS Take 1 tablet by mouth daily.     pravastatin (PRAVACHOL) 40 MG tablet Take 40 mg by mouth at bedtime.     predniSONE (DELTASONE) 10 MG tablet Take 10 mg by mouth daily with breakfast.     VICTOZA 18 MG/3ML SOPN Inject 0.6 mg into the skin daily.   0   vitamin B-12 (CYANOCOBALAMIN) 500 MCG tablet Take 500 mcg by mouth daily.     No current facility-administered medications on file prior to visit.     PAST  MEDICAL HISTORY: Past Medical History:  Diagnosis Date   Arthritis    Back pain    Diabetes mellitus without complication (HCC)    ORAL MEDS-NO INSULIN   GERD (gastroesophageal reflux disease)    OCCAS AFTER EATING CERTAIN FOODS   Hyperlipidemia    Hypertension    Joint pain    Rheumatoid arthritis ([place])    Shortness of breath    Sleep apnea    Ureteral stone    LEFT  --DISCOMFORT    PAST SURGICAL HISTORY: Past Surgical History:  Procedure Laterality Date   ANAL FISSURE REPAIR     COLONOSCOPY WITH PROPOFOL N/A 03/02/2018   Procedure: COLONOSCOPY WITH PROPOFOL;  Surgeon: Carol Ada, MD;  Location: [facility];  Service: Endoscopy;  Laterality: N/A;   CYSTOSCOPY WITH URETEROSCOPY  05/16/2012   Procedure: CYSTOSCOPY WITH URETEROSCOPY;  Surgeon: Molli Hazard, MD;  Location:  [facility];  Service: Urology;  Laterality: Left;  left ureteral stent placement   HOLMIUM LASER APPLICATION  12/28/9483   Procedure: HOLMIUM LASER APPLICATION;  Surgeon: Molli Hazard, MD;  Location: [facility];  Service: Urology;  Laterality: Left;   KNEE ARTHROSCOPY     SEVERAL  SURGERIES BOTH KNEES   POLYPECTOMY  03/02/2018   Procedure: POLYPECTOMY;  Surgeon: Carol Ada, MD;  [facility];  Service: Endoscopy;;   SUBMUCOSAL INJECTION  03/02/2018   Procedure: SUBMUCOSAL INJECTION saline;  Surgeon: Carol Ada, MD;  [facility];  Service: Endoscopy;;   VASCECTOMY      SOCIAL HISTORY: Social History   Tobacco Use   Smoking status: Former Smoker   Smokeless tobacco: Never Used   Tobacco comment: QUIT SMOKING MANY YRS AGO  Substance Use Topics   Alcohol use: Yes    Comment: OCCAS   Drug use: No    FAMILY HISTORY: Family History  Problem Relation Age of Onset   Hypertension Mother    Obesity Father     ROS: Review of Systems  Constitutional: Positive for weight loss.  Cardiovascular: Negative for chest pain.       Negative chest  pressure  Neurological: Negative for headaches.  Endo/Heme/Allergies:       Negative hypoglycemia    PHYSICAL EXAM: Blood pressure 127/74, pulse 75, temperature 97.8 F (36.6 C), temperature source Oral, height 5\' 7"  (1.702 m), weight (!) 327 lb (148.3 kg), SpO2 95 %. Body mass index is 51.22 kg/m. Physical Exam Vitals signs reviewed.  Constitutional:      Appearance: Normal appearance. He is obese.  Cardiovascular:     Rate and Rhythm: Normal rate.     Pulses: Normal pulses.  Pulmonary:     Effort: Pulmonary effort is normal.     Breath sounds: Normal breath sounds.  Musculoskeletal: Normal range of motion.  Skin:    General: Skin is warm and dry.  Neurological:     Mental Status: He is alert and oriented to person, place, and time.  Psychiatric:        Mood and Affect: Mood normal.        Behavior: Behavior normal.     RECENT LABS AND TESTS: BMET    Component Value Date/Time   NA 137 02/06/2019   K 4.4 02/06/2019   CL 100 05/15/2012 1115   CO2 23 05/15/2012 1115   GLUCOSE 185 (H) 05/15/2012 1115   BUN 36 (A) 02/06/2019   CREATININE 1.9 (A) 02/06/2019   CREATININE 2.12 (H) 05/15/2012 1115   CALCIUM 10.1 05/15/2012 1115   GFRNONAA 32 (L) 05/15/2012 1115   GFRAA 37 (L) 05/15/2012 1115   Lab Results  Component Value Date   HGBA1C 6.10 02/06/2019   Lab Results  Component Value Date   INSULIN 18.7 03/07/2019   CBC    Component Value Date/Time   WBC 4.2 03/07/2019 1205   WBC 5.1 05/15/2012 1115   RBC 4.39 03/07/2019 1205   RBC 4.11 (L) 05/15/2012 1115   HGB 13.9 03/07/2019 1205   HCT 40.9 03/07/2019 1205   PLT 111 (A) 02/06/2019   MCV 93 03/07/2019 1205   MCH 31.7 03/07/2019 1205   MCH 30.2 05/15/2012 1115   MCHC 34.0 03/07/2019 1205   MCHC 33.4 05/15/2012 1115   RDW 14.1 03/07/2019 1205   LYMPHSABS 1.4 03/07/2019 1205   EOSABS 0.1 03/07/2019 1205   BASOSABS 0.0 03/07/2019 1205   Iron/TIBC/Ferritin/ %Sat No results found for: IRON,  TIBC,  FERRITIN, IRONPCTSAT Lipid Panel     Component Value Date/Time   CHOL 196 02/06/2019   TRIG 102 02/06/2019   HDL 109 (A) 02/06/2019   LDLCALC 1 02/06/2019   Hepatic Function Panel     Component Value Date/Time   AST 22 02/06/2019   ALT 26 02/06/2019      Component Value Date/Time   TSH 1.570 03/07/2019 1205   TSH 1.46 02/06/2019      OBESITY BEHAVIORAL INTERVENTION VISIT  Today's visit was # 3   Starting weight: 345 lbs Starting date: 03/07/2019 Today's weight : 327 lbs  Today's date: 04/08/2019 Total lbs lost to date: 98    ASK: We discussed the diagnosis of obesity with Lance Hausen Sr. today and Marinus agreed to give [place] permission to discuss obesity behavioral modification therapy today.  ASSESS: Raliegh has the diagnosis of obesity and his BMI today is 51.2 Kaysan is in the action stage of change   ADVISE: Savon was educated on the multiple health risks of obesity as well as the benefit of weight loss to improve his health. He was advised of the need for long term treatment and the importance of lifestyle modifications to improve his current health and to decrease his risk of future health problems.  AGREE: Multiple dietary modification options and treatment options were discussed and  Abram agreed to follow the recommendations documented in the above note.  ARRANGE: Sher was educated on the importance of frequent visits to treat obesity as outlined per CMS and USPSTF guidelines and agreed to schedule his next follow up appointment today.  I, Trixie Dredge, am acting as transcriptionist for Ilene Qua, MD  I have reviewed the above documentation for accuracy and completeness, and I agree with the above. - Ilene Qua, MD

## 2019-04-23 ENCOUNTER — Encounter (INDEPENDENT_AMBULATORY_CARE_PROVIDER_SITE_OTHER): Payer: Self-pay | Admitting: Bariatrics

## 2019-04-23 ENCOUNTER — Ambulatory Visit (INDEPENDENT_AMBULATORY_CARE_PROVIDER_SITE_OTHER): Payer: Medicare HMO | Admitting: Bariatrics

## 2019-04-23 ENCOUNTER — Other Ambulatory Visit: Payer: Self-pay

## 2019-04-23 VITALS — BP 123/74 | HR 65 | Temp 98.0°F | Ht 67.0 in | Wt 323.0 lb

## 2019-04-23 DIAGNOSIS — E1165 Type 2 diabetes mellitus with hyperglycemia: Secondary | ICD-10-CM

## 2019-04-23 DIAGNOSIS — Z6841 Body Mass Index (BMI) 40.0 and over, adult: Secondary | ICD-10-CM

## 2019-04-23 DIAGNOSIS — I10 Essential (primary) hypertension: Secondary | ICD-10-CM

## 2019-04-24 ENCOUNTER — Encounter (INDEPENDENT_AMBULATORY_CARE_PROVIDER_SITE_OTHER): Payer: Self-pay | Admitting: Bariatrics

## 2019-04-24 NOTE — Progress Notes (Signed)
Office: [phone]  /  Fax: [phone]   HPI:   Chief Complaint: OBESITY Lance Gregory is here to discuss his progress with his obesity treatment plan. He is on the Category 3 plan and is following his eating plan approximately 90% of the time. He states he is walking 15 minutes 7 times per week. Lance Gregory is a patient of Dr. Adair Patter and this is my first visit with him.  His weight is (!) 323 lb (146.5 kg) today and has had a weight loss of 4 pounds over a period of 2 weeks since his last visit. He has lost 22 lbs since starting treatment with [place].  Diabetes II with Hyperglycemia Lance Gregory has a diagnosis of diabetes type II. Lance Gregory states fasting blood sugars range between 100 and 120 with no hypoglycemic episodes. Last A1c was 6.10 on 02/06/2019 with an insulin of 18.7 on 03/07/2019. He has been working on intensive lifestyle modifications including diet, exercise, and weight loss to help control his blood glucose levels. No polyphagia.  Hypertension Lance Hausen Sr. is a 68 y.o. male with hypertension.  Lance Hausen Sr. denies chest pain or shortness of breath on exertion. He is working weight loss to help control his blood pressure with the goal of decreasing his risk of heart attack and stroke. Lance Gregory's blood pressure is well controlled at 123/74.  ASSESSMENT AND PLAN:  Type 2 diabetes mellitus with hyperglycemia, without long-term current use of insulin (HCC)  Essential hypertension  Class 3 severe obesity with serious comorbidity and body mass index (BMI) of 50.0 to 59.9 in adult, unspecified obesity type (Lance Gregory)  PLAN:  Diabetes II with Hyperglycemia Lance Gregory has been given extensive diabetes education by myself today including ideal fasting and post-prandial blood glucose readings, individual ideal HgA1c goals  and hypoglycemia prevention. We discussed the importance of good blood sugar control to decrease the likelihood of diabetic complications such as nephropathy, neuropathy, limb  loss, blindness, coronary artery disease, and death. We discussed the importance of intensive lifestyle modification including diet, exercise and weight loss as the first line treatment for diabetes. Lance Gregory agrees to continue his diabetes medications and will follow-up at the agreed upon time.  Hypertension We discussed sodium restriction, working on healthy weight loss, and a regular exercise program as the means to achieve improved blood pressure control. Lance Gregory agreed with this plan and agreed to follow up as directed. We will continue to monitor his blood pressure as well as his progress with the above lifestyle modifications. He will continue his medications as prescribed and will watch for signs of hypotension as he continues his lifestyle modifications.  Obesity Lance Gregory is currently in the action stage of change. As such, his goal is to continue with weight loss efforts. He has agreed to follow the Category 3 plan.  Lance Gregory will work on meal planning and intentional eating. Lance Gregory has been instructed to continue to walk with a goal to use an exercise bike for weight loss and overall health benefits. We discussed the following Behavioral Modification Strategies today: increasing lean protein intake, decreasing simple carbohydrates, increasing vegetables, increase H20 intake, decrease eating out, no skipping meals, work on meal planning and easy cooking plans, and keeping healthy foods in the home.  Lance Gregory has agreed to follow-up with our clinic in 2-3 weeks. He was informed of the importance of frequent follow-up visits to maximize his success with intensive lifestyle modifications for his multiple health conditions.  ALLERGIES: Allergies  Allergen Reactions   Vicodin [Hydrocodone-Acetaminophen] Other (See Comments)  Remembers he didn't like   Codeine Other (See Comments)    REACTION WAS YEARS AGO-PT DOES NOT REMEMBER WHAT THE REACTION WAS    MEDICATIONS: Current Outpatient  Medications on File Prior to Visit  Medication Sig Dispense Refill   amLODipine (NORVASC) 10 MG tablet Take 10 mg by mouth every morning.     aspirin EC 81 MG tablet Take 81 mg by mouth at bedtime.     carvedilol (COREG) 3.125 MG tablet Take 3.125 mg by mouth 2 (two) times daily with a meal.      Continuous Blood Gluc Receiver (FREESTYLE LIBRE 2 READER SYSTM) DEVI 1 each by Does not apply route daily. As directed 1 Device 0   Continuous Blood Gluc Sensor (FREESTYLE LIBRE 14 DAY SENSOR) MISC 1 each by Does not apply route daily. Apply to skin for 14 days then remove 2 each 0   doxazosin (CARDURA) 4 MG tablet Take 4 mg by mouth at bedtime.     empagliflozin (JARDIANCE) 25 MG TABS tablet Take 25 mg by mouth daily.     furosemide (LASIX) 40 MG tablet Take 40 mg by mouth daily as needed for fluid.     glipiZIDE (GLUCOTROL) 5 MG tablet Take 5 mg by mouth 2 (two) times daily before a meal.     losartan-hydrochlorothiazide (HYZAAR) 100-25 MG tablet Take 1 tablet by mouth daily.      magnesium gluconate (MAGONATE) 500 MG tablet Take 500 mg by mouth 3 (three) times a week.     Methylcellulose, Laxative, (HM FIBER) 500 MG TABS Take 500 mg by mouth daily.     Multiple Vitamin (MULTIVITAMIN WITH MINERALS) TABS Take 1 tablet by mouth daily.     pravastatin (PRAVACHOL) 40 MG tablet Take 40 mg by mouth at bedtime.     VICTOZA 18 MG/3ML SOPN Inject 0.6 mg into the skin daily.   0   vitamin B-12 (CYANOCOBALAMIN) 500 MCG tablet Take 500 mcg by mouth daily.     No current facility-administered medications on file prior to visit.     PAST MEDICAL HISTORY: Past Medical History:  Diagnosis Date   Arthritis    Back pain    Diabetes mellitus without complication (HCC)    ORAL MEDS-NO INSULIN   GERD (gastroesophageal reflux disease)    OCCAS AFTER EATING CERTAIN FOODS   Hyperlipidemia    Hypertension    Joint pain    Rheumatoid arthritis ([place])    Shortness of breath    Sleep apnea     Ureteral stone    LEFT  --DISCOMFORT    PAST SURGICAL HISTORY: Past Surgical History:  Procedure Laterality Date   ANAL FISSURE REPAIR     COLONOSCOPY WITH PROPOFOL N/A 03/02/2018   Procedure: COLONOSCOPY WITH PROPOFOL;  Surgeon: Carol Ada, MD;  Location: [facility];  Service: Endoscopy;  Laterality: N/A;   CYSTOSCOPY WITH URETEROSCOPY  05/16/2012   Procedure: CYSTOSCOPY WITH URETEROSCOPY;  Surgeon: Molli Hazard, MD;  Location: [facility];  Service: Urology;  Laterality: Left;  left ureteral stent placement   HOLMIUM LASER APPLICATION  03/00/9233   Procedure: HOLMIUM LASER APPLICATION;  Surgeon: Molli Hazard, MD;  Location: [facility];  Service: Urology;  Laterality: Left;   KNEE ARTHROSCOPY     SEVERAL  SURGERIES BOTH KNEES   POLYPECTOMY  03/02/2018   Procedure: POLYPECTOMY;  Surgeon: Carol Ada, MD;  Location: [facility];  Service: Endoscopy;;   SUBMUCOSAL INJECTION  03/02/2018   Procedure: SUBMUCOSAL INJECTION saline;  Surgeon: Carol Ada, MD;  Location: [facility];  Service: Endoscopy;;   VASCECTOMY      SOCIAL HISTORY: Social History   Tobacco Use   Smoking status: Former Smoker   Smokeless tobacco: Never Used   Tobacco comment: QUIT SMOKING MANY YRS AGO  Substance Use Topics   Alcohol use: Yes    Comment: OCCAS   Drug use: No    FAMILY HISTORY: Family History  Problem Relation Age of Onset   Hypertension Mother    Obesity Father    ROS: Review of Systems  Respiratory: Negative for shortness of breath.   Cardiovascular: Negative for chest pain.  Endo/Heme/Allergies:       Negative for polyphagia.   PHYSICAL EXAM: Blood pressure 123/74, pulse 65, temperature 98 F (36.7 C), temperature source Oral, height 5\' 7"  (1.702 m), weight (!) 323 lb (146.5 kg), SpO2 96 %. Body mass index is 50.59 kg/m. Physical Exam Vitals signs reviewed.  Constitutional:      Appearance: Normal appearance. He is obese.  Cardiovascular:      Rate and Rhythm: Normal rate.     Pulses: Normal pulses.  Pulmonary:     Effort: Pulmonary effort is normal.     Breath sounds: Normal breath sounds.  Musculoskeletal: Normal range of motion.  Skin:    General: Skin is warm and dry.  Neurological:     Mental Status: He is alert and oriented to person, place, and time.     Comments: Using a 4 point cane for ambulation  Psychiatric:        Behavior: Behavior normal.   RECENT LABS AND TESTS: BMET    Component Value Date/Time   NA 137 02/06/2019   K 4.4 02/06/2019   CL 100 05/15/2012 1115   CO2 23 05/15/2012 1115   GLUCOSE 185 (H) 05/15/2012 1115   BUN 36 (A) 02/06/2019   CREATININE 1.9 (A) 02/06/2019   CREATININE 2.12 (H) 05/15/2012 1115   CALCIUM 10.1 05/15/2012 1115   GFRNONAA 32 (L) 05/15/2012 1115   GFRAA 37 (L) 05/15/2012 1115   Lab Results  Component Value Date   HGBA1C 6.10 02/06/2019   Lab Results  Component Value Date   INSULIN 18.7 03/07/2019   CBC    Component Value Date/Time   WBC 4.2 03/07/2019 1205   WBC 5.1 05/15/2012 1115   RBC 4.39 03/07/2019 1205   RBC 4.11 (L) 05/15/2012 1115   HGB 13.9 03/07/2019 1205   HCT 40.9 03/07/2019 1205   PLT 111 (A) 02/06/2019   MCV 93 03/07/2019 1205   MCH 31.7 03/07/2019 1205   MCH 30.2 05/15/2012 1115   MCHC 34.0 03/07/2019 1205   MCHC 33.4 05/15/2012 1115   RDW 14.1 03/07/2019 1205   LYMPHSABS 1.4 03/07/2019 1205   EOSABS 0.1 03/07/2019 1205   BASOSABS 0.0 03/07/2019 1205   Iron/TIBC/Ferritin/ %Sat No results found for: IRON, TIBC, FERRITIN, IRONPCTSAT Lipid Panel     Component Value Date/Time   CHOL 196 02/06/2019   TRIG 102 02/06/2019   HDL 109 (A) 02/06/2019   LDLCALC 1 02/06/2019   Hepatic Function Panel     Component Value Date/Time   AST 22 02/06/2019   ALT 26 02/06/2019      Component Value Date/Time   TSH 1.570 03/07/2019 1205   TSH 1.46 02/06/2019   Results for RED, MANDT SR. (MRN [id number]) as of 04/24/2019 11:13  Ref. Range  03/07/2019 12:05  Vitamin D, 25-Hydroxy Latest Ref Range: 30.0 - 100.0 ng/mL  54.1   OBESITY BEHAVIORAL INTERVENTION VISIT  Today's visit was #4  Starting weight: 345 lbs Starting date: 03/07/2019 Today's weight: 323 lbs  Today's date: 04/23/2019 Total lbs lost to date: 22 At least 15 minutes were spent on discussing the following behavioral intervention visit.    04/23/2019  Height 5\' 7"  (1.702 m)  Weight 323 lb (146.5 kg) (A)  BMI (Calculated) 50.58  BLOOD PRESSURE - SYSTOLIC 263  BLOOD PRESSURE - DIASTOLIC 74   Body Fat % 78.5 %  Total Body Water (lbs) 142 lbs   ASK: We discussed the diagnosis of obesity with Lance Hausen Sr. today and Lance Gregory agreed to give [place] permission to discuss obesity behavioral modification therapy today.  ASSESS: Lance Gregory has the diagnosis of obesity and his BMI today is 50.7. [name] is in the action stage of change.   ADVISE: Lance was educated on the multiple health risks of obesity as well as the benefit of weight loss to improve his health. He was advised of the need for long term treatment and the importance of lifestyle modifications to improve his current health and to decrease his risk of future health problems.  AGREE: Multiple dietary modification options and treatment options were discussed and  Lance Gregory agreed to follow the recommendations documented in the above note.  ARRANGE: Lance Gregory was educated on the importance of frequent visits to treat obesity as outlined per CMS and USPSTF guidelines and agreed to schedule his next follow up appointment today.  Migdalia Dk, am acting as Location manager for [employer], DO  I have reviewed the above documentation for accuracy and completeness, and I agree with the above. -Jearld Lesch, DO

## 2019-05-14 ENCOUNTER — Ambulatory Visit (INDEPENDENT_AMBULATORY_CARE_PROVIDER_SITE_OTHER): Payer: Medicare HMO | Admitting: Bariatrics

## 2019-06-03 DIAGNOSIS — N183 Chronic kidney disease, stage 3 unspecified: Secondary | ICD-10-CM | POA: Diagnosis not present

## 2019-06-03 DIAGNOSIS — N189 Chronic kidney disease, unspecified: Secondary | ICD-10-CM | POA: Diagnosis not present

## 2019-06-07 DIAGNOSIS — N183 Chronic kidney disease, stage 3 unspecified: Secondary | ICD-10-CM | POA: Diagnosis not present

## 2019-06-07 DIAGNOSIS — R600 Localized edema: Secondary | ICD-10-CM | POA: Diagnosis not present

## 2019-06-07 DIAGNOSIS — D631 Anemia in chronic kidney disease: Secondary | ICD-10-CM | POA: Diagnosis not present

## 2019-06-07 DIAGNOSIS — I129 Hypertensive chronic kidney disease with stage 1 through stage 4 chronic kidney disease, or unspecified chronic kidney disease: Secondary | ICD-10-CM | POA: Diagnosis not present

## 2019-06-07 DIAGNOSIS — Z87442 Personal history of urinary calculi: Secondary | ICD-10-CM | POA: Diagnosis not present

## 2019-06-10 DIAGNOSIS — E119 Type 2 diabetes mellitus without complications: Secondary | ICD-10-CM | POA: Diagnosis not present

## 2019-07-24 ENCOUNTER — Ambulatory Visit: Payer: Medicare HMO | Attending: Internal Medicine

## 2019-07-24 DIAGNOSIS — Z23 Encounter for immunization: Secondary | ICD-10-CM | POA: Insufficient documentation

## 2019-07-24 NOTE — Progress Notes (Signed)
   [id number]  Name:  Lance Belmares Sr.    MRN: [id number] DOB: [birth year]  07/24/2019  [name] was observed post Covid-19 immunization for 15 minutes without incidence. He was provided with Vaccine Information Sheet and instruction to access the V-Safe system.   [name] was instructed to call 911 with any severe reactions post vaccine: Marland Kitchen Difficulty breathing  . Swelling of your face and throat  . A fast heartbeat  . A bad rash all over your body  . Dizziness and weakness    Immunizations Administered    Name Date Dose VIS Date Route   Pfizer COVID-19 Vaccine 07/24/2019 12:17 PM 0.3 mL 06/14/2019 Intramuscular   Manufacturer: [place]   Lot: [id number]   [place]: [id number]

## 2019-08-12 ENCOUNTER — Ambulatory Visit: Payer: Medicare HMO | Attending: Internal Medicine

## 2019-08-12 DIAGNOSIS — Z23 Encounter for immunization: Secondary | ICD-10-CM

## 2019-08-12 NOTE — Progress Notes (Signed)
   [facility]  Name:  Lance Deeg Sr.    MRN: [id number] DOB: 09-27-1950  08/12/2019  Lance Gregory was observed post Covid-19 immunization for 15 minutes without incidence. He was provided with Vaccine Information Sheet and instruction to access the V-Safe system.   Lance Gregory was instructed to call 911 with any severe reactions post vaccine: Marland Kitchen Difficulty breathing  . Swelling of your face and throat  . A fast heartbeat  . A bad rash all over your body  . Dizziness and weakness    Immunizations Administered    Name Date Dose VIS Date Route   Pfizer COVID-19 Vaccine 08/12/2019 10:58 AM 0.3 mL 06/14/2019 Intramuscular   Manufacturer: [place]   Lot: [id number]   [place]: [id number]

## 2019-08-13 DIAGNOSIS — Z125 Encounter for screening for malignant neoplasm of prostate: Secondary | ICD-10-CM | POA: Diagnosis not present

## 2019-08-13 DIAGNOSIS — E1122 Type 2 diabetes mellitus with diabetic chronic kidney disease: Secondary | ICD-10-CM | POA: Diagnosis not present

## 2019-08-13 DIAGNOSIS — Z0000 Encounter for general adult medical examination without abnormal findings: Secondary | ICD-10-CM | POA: Diagnosis not present

## 2019-08-13 DIAGNOSIS — I10 Essential (primary) hypertension: Secondary | ICD-10-CM | POA: Diagnosis not present

## 2019-08-20 DIAGNOSIS — Z125 Encounter for screening for malignant neoplasm of prostate: Secondary | ICD-10-CM | POA: Diagnosis not present

## 2019-08-20 DIAGNOSIS — Z0000 Encounter for general adult medical examination without abnormal findings: Secondary | ICD-10-CM | POA: Diagnosis not present

## 2019-08-20 DIAGNOSIS — M109 Gout, unspecified: Secondary | ICD-10-CM | POA: Diagnosis not present

## 2019-08-20 DIAGNOSIS — N183 Chronic kidney disease, stage 3 unspecified: Secondary | ICD-10-CM | POA: Diagnosis not present

## 2019-08-20 DIAGNOSIS — E118 Type 2 diabetes mellitus with unspecified complications: Secondary | ICD-10-CM | POA: Diagnosis not present

## 2019-08-20 DIAGNOSIS — E7800 Pure hypercholesterolemia, unspecified: Secondary | ICD-10-CM | POA: Diagnosis not present

## 2019-08-20 DIAGNOSIS — I10 Essential (primary) hypertension: Secondary | ICD-10-CM | POA: Diagnosis not present

## 2020-02-11 DIAGNOSIS — Z125 Encounter for screening for malignant neoplasm of prostate: Secondary | ICD-10-CM | POA: Diagnosis not present

## 2020-02-11 DIAGNOSIS — Z0000 Encounter for general adult medical examination without abnormal findings: Secondary | ICD-10-CM | POA: Diagnosis not present

## 2020-02-11 DIAGNOSIS — E7800 Pure hypercholesterolemia, unspecified: Secondary | ICD-10-CM | POA: Diagnosis not present

## 2020-02-11 DIAGNOSIS — I10 Essential (primary) hypertension: Secondary | ICD-10-CM | POA: Diagnosis not present

## 2020-02-11 DIAGNOSIS — E559 Vitamin D deficiency, unspecified: Secondary | ICD-10-CM | POA: Diagnosis not present

## 2020-02-20 DIAGNOSIS — N183 Chronic kidney disease, stage 3 unspecified: Secondary | ICD-10-CM | POA: Diagnosis not present

## 2020-02-20 DIAGNOSIS — I10 Essential (primary) hypertension: Secondary | ICD-10-CM | POA: Diagnosis not present

## 2020-02-20 DIAGNOSIS — E559 Vitamin D deficiency, unspecified: Secondary | ICD-10-CM | POA: Diagnosis not present

## 2020-02-20 DIAGNOSIS — E118 Type 2 diabetes mellitus with unspecified complications: Secondary | ICD-10-CM | POA: Diagnosis not present

## 2020-02-20 DIAGNOSIS — R05 Cough: Secondary | ICD-10-CM | POA: Diagnosis not present

## 2020-02-20 DIAGNOSIS — M1990 Unspecified osteoarthritis, unspecified site: Secondary | ICD-10-CM | POA: Diagnosis not present

## 2020-02-20 DIAGNOSIS — E785 Hyperlipidemia, unspecified: Secondary | ICD-10-CM | POA: Diagnosis not present

## 2020-03-02 DIAGNOSIS — I129 Hypertensive chronic kidney disease with stage 1 through stage 4 chronic kidney disease, or unspecified chronic kidney disease: Secondary | ICD-10-CM | POA: Diagnosis not present

## 2020-03-02 DIAGNOSIS — R600 Localized edema: Secondary | ICD-10-CM | POA: Diagnosis not present

## 2020-03-02 DIAGNOSIS — D631 Anemia in chronic kidney disease: Secondary | ICD-10-CM | POA: Diagnosis not present

## 2020-03-02 DIAGNOSIS — N1832 Chronic kidney disease, stage 3b: Secondary | ICD-10-CM | POA: Diagnosis not present

## 2020-03-02 DIAGNOSIS — Z87442 Personal history of urinary calculi: Secondary | ICD-10-CM | POA: Diagnosis not present

## 2020-03-25 IMAGING — US US RENAL
1 series · 14 of 25 positions shown · non-contrast
Comparison: Multiple prior studies most recent sonogram from
March 14, 2013, CT assessment from 04/09/2014. exam limited
secondary to patient body habitus.

CLINICAL DATA: Chronic renal disease,

EXAM:
RENAL / URINARY TRACT ULTRASOUND COMPLETE

[Series 1: us renal · 0.31mm/px · 14 of 35 slices shown]
[im 1/35]
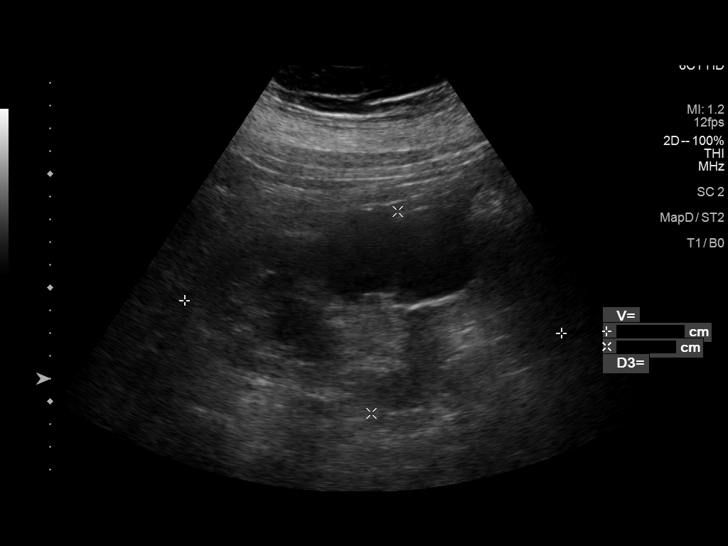
[im 3/35]
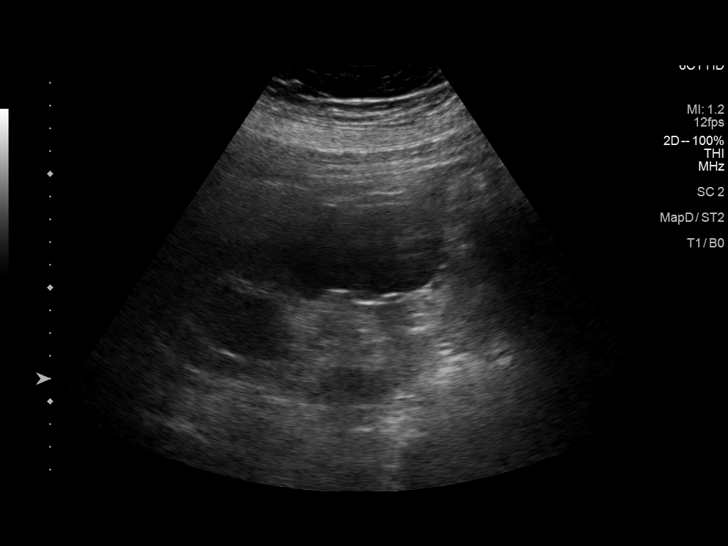
[im 6/35]
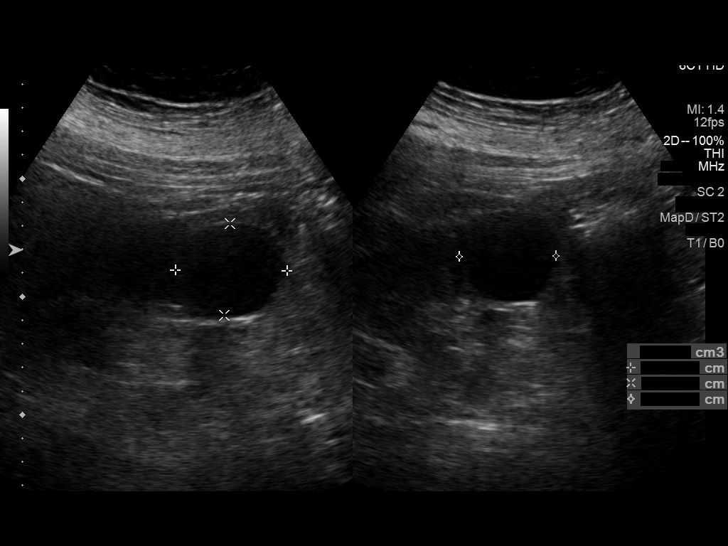
[im 9/35]
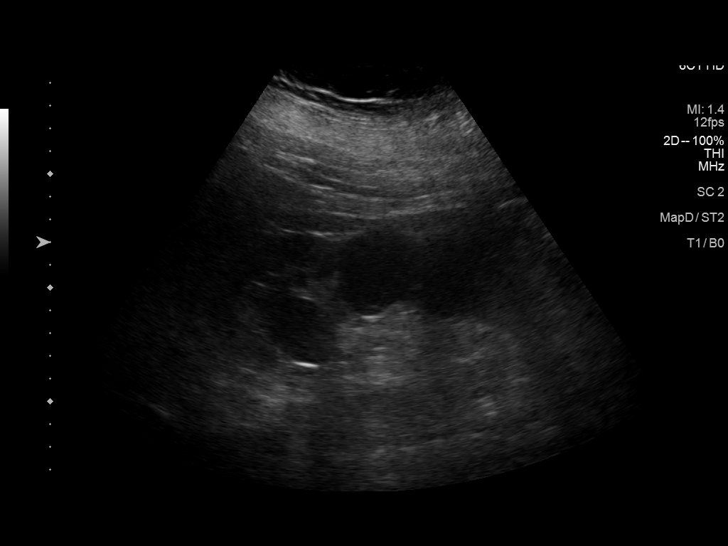
[im 12/35]
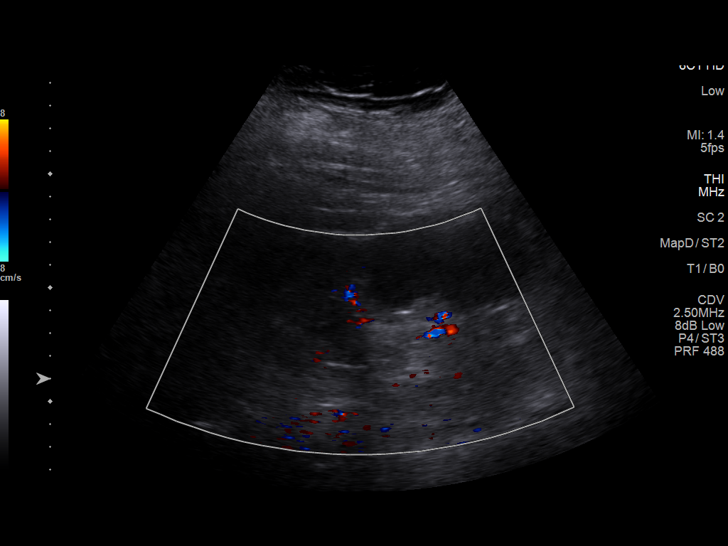
[im 13/35]
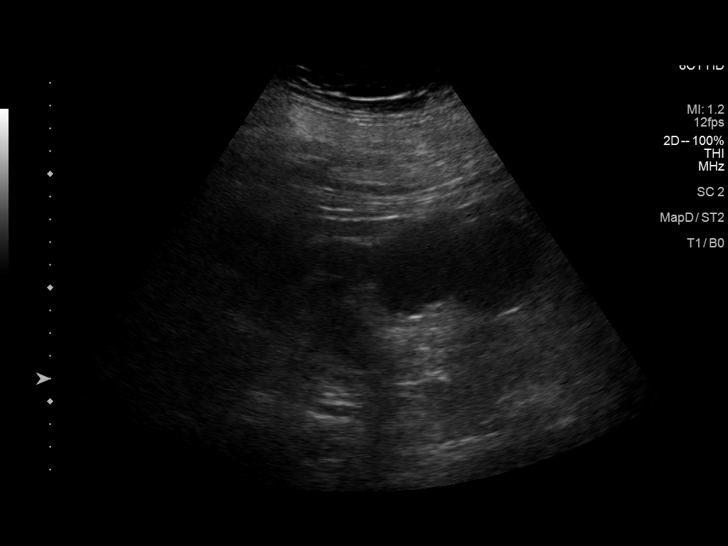
[im 16/35]
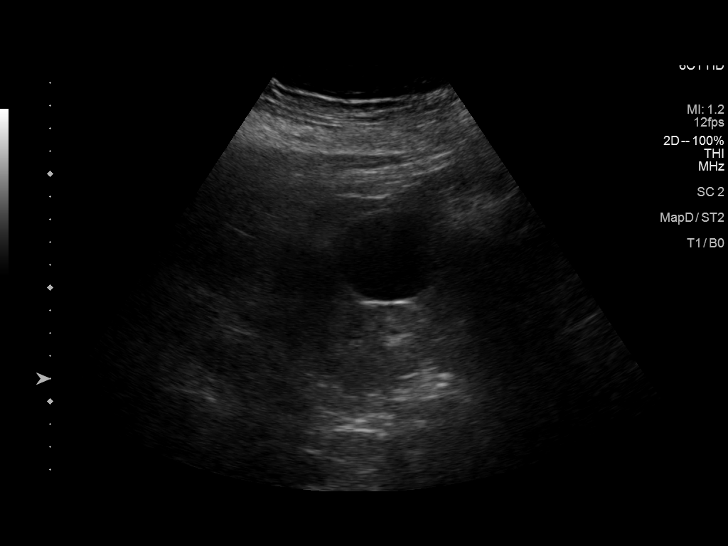
[im 19/35]
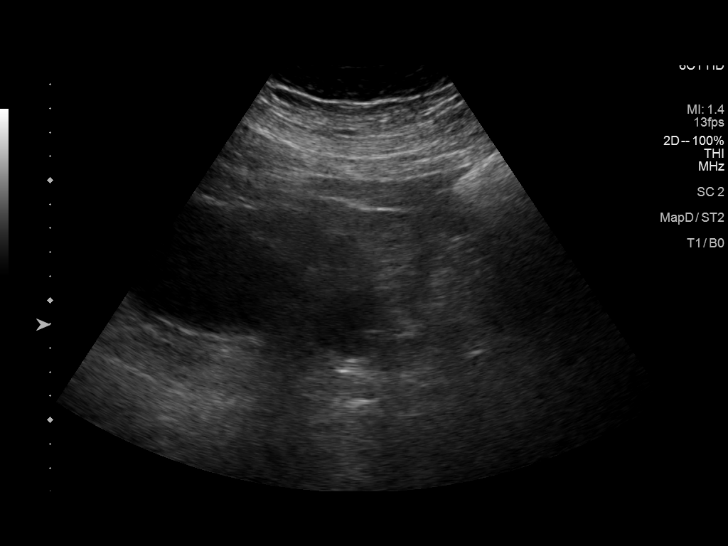
[im 22/35]
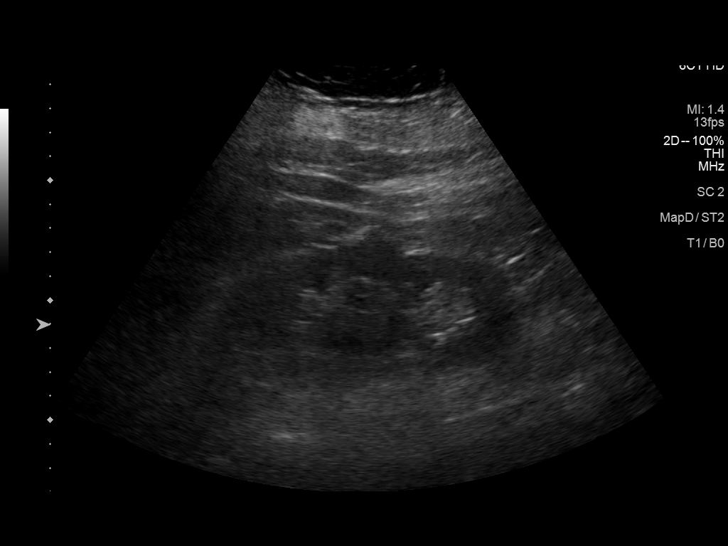
[im 23/35]
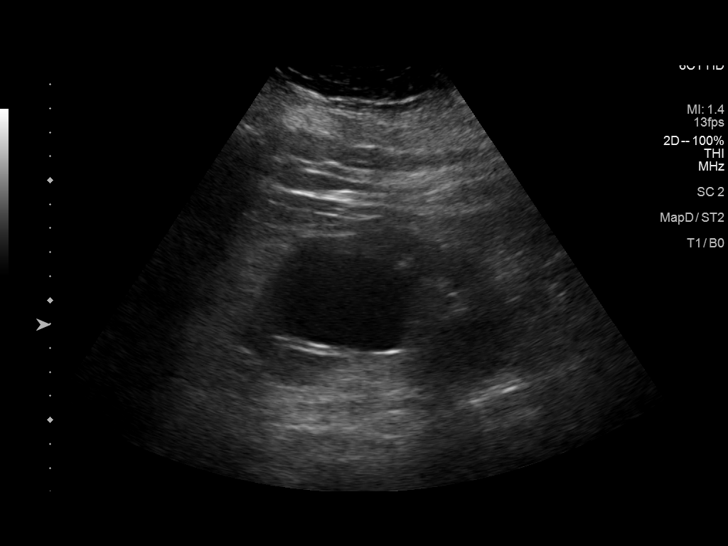
[im 26/35]
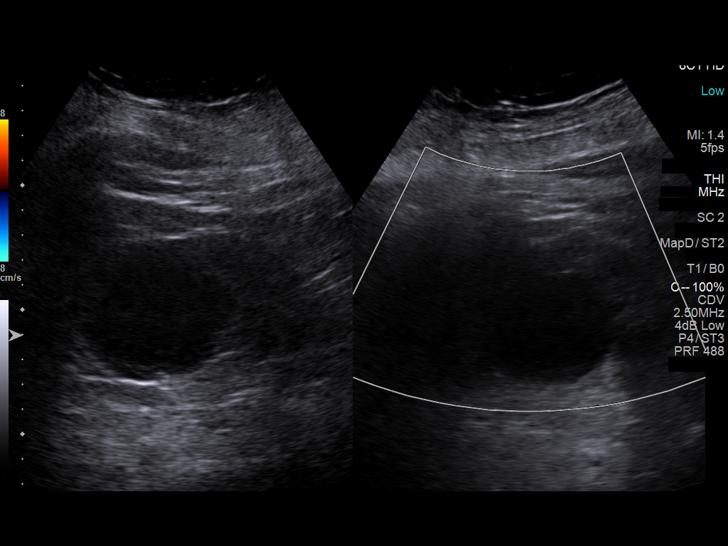
[im 29/35]
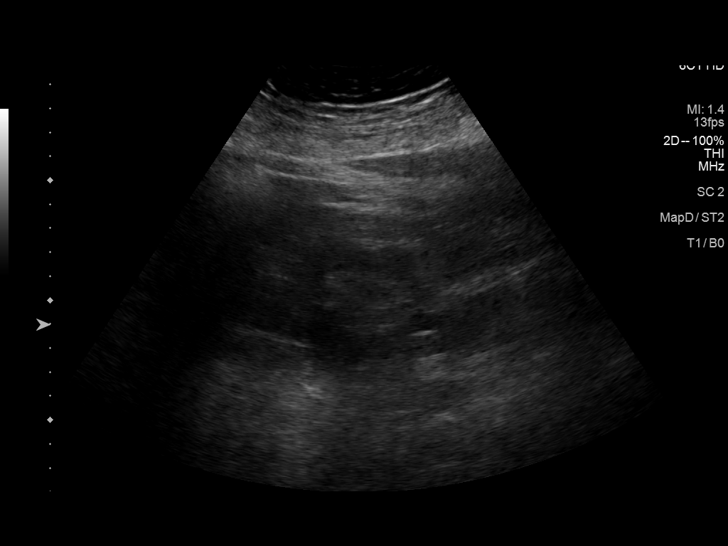
[im 32/35]
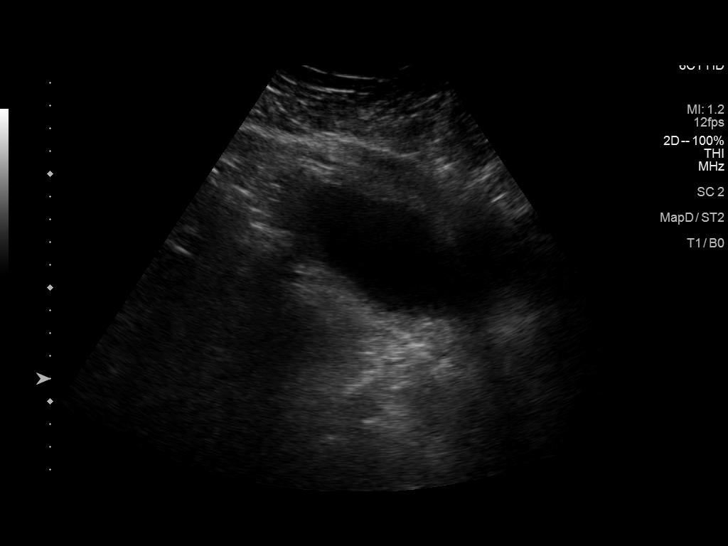
[im 35/35]
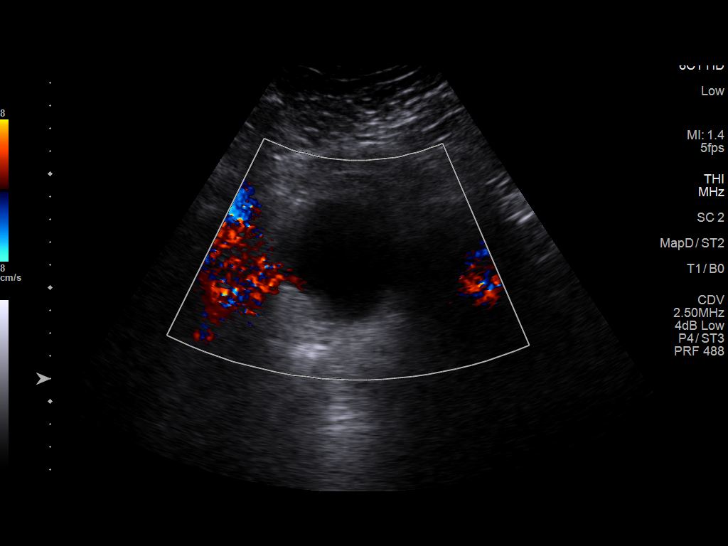

[14 of 25 positions shown; findings below may reference images not displayed]

FINDINGS: Right Kidney:

Renal measurements: 16.6 x 9.0 x 7.2 cm = volume: Volume 558 mL.
Mild cortical thinning. Multiple cysts as on prior studies, largest
measuring 4.7 x 4 x 4.1 cm. No signs of hydronephrosis. No visible
calculi.

Left Kidney:

Renal measurements: 13.0 x 7.0 x 7.5 cm = volume: 341 mL. Mild
cortical thinning. Multiple renal cysts, largest 6.3 x 5.5 x 5.6 cm.
Assessment for calculi limited as above due to patient body habitus.
No signs of hydronephrosis.

Bladder:

Visualized portions of the urinary bladder, underdistended, are
unremarkable.
IMPRESSION: Bilateral renal cysts as outlined above with evidence of mild
cortical thinning, similar to previous exam.

## 2020-04-20 DIAGNOSIS — L299 Pruritus, unspecified: Secondary | ICD-10-CM | POA: Diagnosis not present

## 2020-04-20 DIAGNOSIS — R21 Rash and other nonspecific skin eruption: Secondary | ICD-10-CM | POA: Diagnosis not present

## 2020-05-05 DIAGNOSIS — I872 Venous insufficiency (chronic) (peripheral): Secondary | ICD-10-CM | POA: Diagnosis not present

## 2020-05-05 DIAGNOSIS — L308 Other specified dermatitis: Secondary | ICD-10-CM | POA: Diagnosis not present

## 2020-06-02 DIAGNOSIS — I872 Venous insufficiency (chronic) (peripheral): Secondary | ICD-10-CM | POA: Diagnosis not present

## 2020-06-11 DIAGNOSIS — Z125 Encounter for screening for malignant neoplasm of prostate: Secondary | ICD-10-CM | POA: Diagnosis not present

## 2020-06-11 DIAGNOSIS — E118 Type 2 diabetes mellitus with unspecified complications: Secondary | ICD-10-CM | POA: Diagnosis not present

## 2020-06-11 DIAGNOSIS — I10 Essential (primary) hypertension: Secondary | ICD-10-CM | POA: Diagnosis not present

## 2020-06-11 DIAGNOSIS — M109 Gout, unspecified: Secondary | ICD-10-CM | POA: Diagnosis not present

## 2020-06-11 DIAGNOSIS — E7800 Pure hypercholesterolemia, unspecified: Secondary | ICD-10-CM | POA: Diagnosis not present

## 2020-06-11 DIAGNOSIS — E559 Vitamin D deficiency, unspecified: Secondary | ICD-10-CM | POA: Diagnosis not present

## 2020-06-18 DIAGNOSIS — E559 Vitamin D deficiency, unspecified: Secondary | ICD-10-CM | POA: Diagnosis not present

## 2020-06-18 DIAGNOSIS — Z0000 Encounter for general adult medical examination without abnormal findings: Secondary | ICD-10-CM | POA: Diagnosis not present

## 2020-06-18 DIAGNOSIS — I10 Essential (primary) hypertension: Secondary | ICD-10-CM | POA: Diagnosis not present

## 2020-06-18 DIAGNOSIS — N184 Chronic kidney disease, stage 4 (severe): Secondary | ICD-10-CM | POA: Diagnosis not present

## 2020-06-18 DIAGNOSIS — E785 Hyperlipidemia, unspecified: Secondary | ICD-10-CM | POA: Diagnosis not present

## 2020-06-18 DIAGNOSIS — E118 Type 2 diabetes mellitus with unspecified complications: Secondary | ICD-10-CM | POA: Diagnosis not present

## 2020-06-18 DIAGNOSIS — M109 Gout, unspecified: Secondary | ICD-10-CM | POA: Diagnosis not present

## 2020-06-18 DIAGNOSIS — M1990 Unspecified osteoarthritis, unspecified site: Secondary | ICD-10-CM | POA: Diagnosis not present

## 2020-06-22 DIAGNOSIS — N183 Chronic kidney disease, stage 3 unspecified: Secondary | ICD-10-CM | POA: Diagnosis not present

## 2020-06-22 DIAGNOSIS — M1990 Unspecified osteoarthritis, unspecified site: Secondary | ICD-10-CM | POA: Diagnosis not present

## 2020-06-22 DIAGNOSIS — M25512 Pain in left shoulder: Secondary | ICD-10-CM | POA: Diagnosis not present

## 2020-06-22 DIAGNOSIS — M7582 Other shoulder lesions, left shoulder: Secondary | ICD-10-CM | POA: Diagnosis not present

## 2020-06-22 DIAGNOSIS — M109 Gout, unspecified: Secondary | ICD-10-CM | POA: Diagnosis not present

## 2020-08-17 ENCOUNTER — Other Ambulatory Visit: Payer: Self-pay

## 2020-08-17 ENCOUNTER — Ambulatory Visit (INDEPENDENT_AMBULATORY_CARE_PROVIDER_SITE_OTHER): Payer: Medicare HMO

## 2020-08-17 ENCOUNTER — Ambulatory Visit (INDEPENDENT_AMBULATORY_CARE_PROVIDER_SITE_OTHER): Payer: Medicare HMO | Admitting: Orthopaedic Surgery

## 2020-08-17 VITALS — Ht 68.5 in | Wt 300.0 lb

## 2020-08-17 DIAGNOSIS — M25561 Pain in right knee: Secondary | ICD-10-CM

## 2020-08-17 DIAGNOSIS — G8929 Other chronic pain: Secondary | ICD-10-CM | POA: Diagnosis not present

## 2020-08-17 DIAGNOSIS — M25551 Pain in right hip: Secondary | ICD-10-CM

## 2020-08-17 DIAGNOSIS — M1712 Unilateral primary osteoarthritis, left knee: Secondary | ICD-10-CM | POA: Insufficient documentation

## 2020-08-17 DIAGNOSIS — M1711 Unilateral primary osteoarthritis, right knee: Secondary | ICD-10-CM | POA: Insufficient documentation

## 2020-08-17 DIAGNOSIS — M25562 Pain in left knee: Secondary | ICD-10-CM | POA: Diagnosis not present

## 2020-08-17 DIAGNOSIS — M069 Rheumatoid arthritis, unspecified: Secondary | ICD-10-CM | POA: Diagnosis not present

## 2020-08-17 NOTE — Progress Notes (Signed)
Office Visit Note   Patient: Lance Fuston Sr.           Date of Birth: 06-13-1951           MRN: [id number] Visit Date: 08/17/2020              Requested by: Jani Gravel, MD [facility] [place],  [place] [address] PCP: Jani Gravel, MD   Assessment & Plan: Visit Diagnoses:  1. Pain in right hip   2. Chronic pain of right knee   3. Chronic pain of left knee   4. Unilateral primary osteoarthritis, left knee   5. Unilateral primary osteoarthritis, right knee     Plan: At some point the patient will benefit from bilateral knee replacements.  However, given his weight, he needs to be at a BMI below 40 before we could safely consider this.  I explained the rationale behind this as well.  We will see him back in 3 months for repeat weight and BMI calculation.  I would not recommend any other injections at this standpoint given the fact that they will not help anymore.  I have recommended Voltaren gel and weight loss.The patient meets the AMA guidelines for Morbid (severe) obesity with a BMI > 40.0 and I have recommended weight loss.  Follow-Up Instructions: Return in about 3 months (around 11/14/2020).   Orders:  Orders Placed This Encounter  Procedures  . XR HIP UNILAT W OR W/O PELVIS 1V RIGHT  . XR Knee 1-2 Views Right   No orders of the defined types were placed in this encounter.     Procedures: No procedures performed   Clinical Data: No additional findings.   Subjective: Chief Complaint  Patient presents with  . Left Hip - Pain  . Right Knee - Pain  Patient is a very pleasant 70 year old gentleman that I am seeing for the first time.  He said right hip and bilateral knee pain for some time now.  He does ambulate using a cane.  He is diabetic but reports a hemoglobin A1c of under 7.  He says the pain is getting worse in his hip but denies any groin pain.  He does weigh 300 pounds and has a BMI of almost 45.  His pain is on a daily basis and is detrimentally  affecting his mobility, his quality of life and his activities day living.  He has had multiple injections in his knees in the past.  HPI  Review of Systems He currently denies any headache, chest pain, shortness of breath, fever, chills, nausea, vomiting  Objective: Vital Signs: Ht 5' 8.5" (1.74 m)   Wt 300 lb (136.1 kg)   BMI 44.95 kg/m   Physical Exam He is alert and orient x3 and in no acute distress Ortho Exam Examination of both knees shows varus malalignment.  Both knees show significant medial joint line tenderness with patellofemoral crepitation on exam of both knees.  His right hip moves smoothly and fluidly with no pain in the groin.  There is only pain of the trochanteric area to palpation. Specialty Comments:  No specialty comments available.  Imaging: XR HIP UNILAT W OR W/O PELVIS 1V RIGHT  Result Date: 08/17/2020 An AP pelvis and lateral right hip shows no acute findings of the right hip.  XR Knee 1-2 Views Right  Result Date: 08/17/2020 An AP and lateral of the right knee show severe end-stage arthritis of the right knee with complete loss of medial joint  space in the patellofemoral joint.  There is varus malalignment with bone-on-bone wear of the medial side.  There are osteophytes in all 3 compartments.    PMFS History: Patient Active Problem List   Diagnosis Date Noted  . Unilateral primary osteoarthritis, left knee 08/17/2020  . Unilateral primary osteoarthritis, right knee 08/17/2020  . Degeneration of lumbar intervertebral disc 11/08/2018  . Diabetes mellitus ([place]) 10/02/2018  . Gout 10/02/2018  . Hypercholesterolemia 10/02/2018  . Hypertensive disorder 10/02/2018  . Recurrent epistaxis 09/14/2017   Past Medical History:  Diagnosis Date  . Arthritis   . Back pain   . Diabetes mellitus without complication (HCC)    ORAL MEDS-NO INSULIN  . GERD (gastroesophageal reflux disease)    OCCAS AFTER EATING CERTAIN FOODS  . Hyperlipidemia   .  Hypertension   . Joint pain   . Rheumatoid arthritis ([place])   . Shortness of breath   . Sleep apnea   . Ureteral stone    LEFT  --DISCOMFORT    Family History  Problem Relation Age of Onset  . Hypertension Mother   . Obesity Father     Past Surgical History:  Procedure Laterality Date  . ANAL FISSURE REPAIR    . COLONOSCOPY WITH PROPOFOL N/A 03/02/2018   Procedure: COLONOSCOPY WITH PROPOFOL;  Surgeon: Carol Ada, MD;  [facility];  Service: Endoscopy;  Laterality: N/A;  . CYSTOSCOPY WITH URETEROSCOPY  05/16/2012   Procedure: CYSTOSCOPY WITH URETEROSCOPY;  Surgeon: Molli Hazard, MD;  Location[facility];  Service: Urology;  Laterality: Left;  left ureteral stent placement  . HOLMIUM LASER APPLICATION  17/40/8144   Procedure: HOLMIUM LASER APPLICATION;  Surgeon: Molli Hazard, MD;  Location: [facility];  Service: Urology;  Laterality: Left;  . KNEE ARTHROSCOPY     SEVERAL  SURGERIES BOTH KNEES  . POLYPECTOMY  03/02/2018   Procedure: POLYPECTOMY;  Surgeon: Carol Ada, MD;  [facility];  Service: Endoscopy;;  . SUBMUCOSAL INJECTION  03/02/2018   Procedure: SUBMUCOSAL INJECTION saline;  Surgeon: Carol Ada, MD;  [facility];  Service: Endoscopy;;  . VASCECTOMY     Social History   Occupational History  . Not on file  Tobacco Use  . Smoking status: Former Research scientist (life sciences)  . Smokeless tobacco: Never Used  . Tobacco comment: QUIT SMOKING MANY YRS AGO  Vaping Use  . Vaping Use: Never used  Substance and Sexual Activity  . Alcohol use: Yes    Comment: OCCAS  . Drug use: No  . Sexual activity: Not on file

## 2020-09-01 ENCOUNTER — Ambulatory Visit: Payer: Self-pay

## 2020-09-02 ENCOUNTER — Encounter (INDEPENDENT_AMBULATORY_CARE_PROVIDER_SITE_OTHER): Payer: Self-pay | Admitting: Bariatrics

## 2020-09-02 DIAGNOSIS — E119 Type 2 diabetes mellitus without complications: Secondary | ICD-10-CM | POA: Diagnosis not present

## 2020-09-07 ENCOUNTER — Ambulatory Visit: Payer: Medicare HMO

## 2020-09-09 DIAGNOSIS — E119 Type 2 diabetes mellitus without complications: Secondary | ICD-10-CM | POA: Diagnosis not present

## 2020-09-09 DIAGNOSIS — R946 Abnormal results of thyroid function studies: Secondary | ICD-10-CM | POA: Diagnosis not present

## 2020-09-09 DIAGNOSIS — E559 Vitamin D deficiency, unspecified: Secondary | ICD-10-CM | POA: Diagnosis not present

## 2020-09-09 DIAGNOSIS — E7800 Pure hypercholesterolemia, unspecified: Secondary | ICD-10-CM | POA: Diagnosis not present

## 2020-09-09 DIAGNOSIS — Z125 Encounter for screening for malignant neoplasm of prostate: Secondary | ICD-10-CM | POA: Diagnosis not present

## 2020-09-09 DIAGNOSIS — I10 Essential (primary) hypertension: Secondary | ICD-10-CM | POA: Diagnosis not present

## 2020-09-16 DIAGNOSIS — M109 Gout, unspecified: Secondary | ICD-10-CM | POA: Diagnosis not present

## 2020-09-16 DIAGNOSIS — I10 Essential (primary) hypertension: Secondary | ICD-10-CM | POA: Diagnosis not present

## 2020-09-16 DIAGNOSIS — Z23 Encounter for immunization: Secondary | ICD-10-CM | POA: Diagnosis not present

## 2020-09-16 DIAGNOSIS — N183 Chronic kidney disease, stage 3 unspecified: Secondary | ICD-10-CM | POA: Diagnosis not present

## 2020-09-16 DIAGNOSIS — D61818 Other pancytopenia: Secondary | ICD-10-CM | POA: Diagnosis not present

## 2020-09-16 DIAGNOSIS — Z0000 Encounter for general adult medical examination without abnormal findings: Secondary | ICD-10-CM | POA: Diagnosis not present

## 2020-09-16 DIAGNOSIS — E1122 Type 2 diabetes mellitus with diabetic chronic kidney disease: Secondary | ICD-10-CM | POA: Diagnosis not present

## 2020-09-16 DIAGNOSIS — E7800 Pure hypercholesterolemia, unspecified: Secondary | ICD-10-CM | POA: Diagnosis not present

## 2020-09-18 ENCOUNTER — Telehealth: Payer: Self-pay | Admitting: Hematology and Oncology

## 2020-09-18 NOTE — Telephone Encounter (Signed)
Received a new hem referral from Dr. Theda Sers for pancytopenia. Mr. Lance Gregory has been cld and scheduled to see Dr. Chryl Heck on 3/24 at 11;20am. Pt aware to arrive 20 minutes early.

## 2020-09-24 ENCOUNTER — Telehealth: Payer: Self-pay | Admitting: Hematology and Oncology

## 2020-09-24 ENCOUNTER — Inpatient Hospital Stay: Payer: Medicare HMO

## 2020-09-24 ENCOUNTER — Encounter: Payer: Self-pay | Admitting: Hematology and Oncology

## 2020-09-24 ENCOUNTER — Inpatient Hospital Stay: Payer: Medicare HMO | Attending: Hematology and Oncology | Admitting: Hematology and Oncology

## 2020-09-24 ENCOUNTER — Other Ambulatory Visit: Payer: Self-pay

## 2020-09-24 VITALS — BP 143/60 | HR 73 | Temp 97.4°F | Resp 16 | Ht 68.5 in | Wt 319.9 lb

## 2020-09-24 DIAGNOSIS — E669 Obesity, unspecified: Secondary | ICD-10-CM | POA: Diagnosis not present

## 2020-09-24 DIAGNOSIS — Z87891 Personal history of nicotine dependence: Secondary | ICD-10-CM | POA: Diagnosis not present

## 2020-09-24 DIAGNOSIS — D61818 Other pancytopenia: Secondary | ICD-10-CM

## 2020-09-24 DIAGNOSIS — I10 Essential (primary) hypertension: Secondary | ICD-10-CM

## 2020-09-24 DIAGNOSIS — E1140 Type 2 diabetes mellitus with diabetic neuropathy, unspecified: Secondary | ICD-10-CM | POA: Diagnosis not present

## 2020-09-24 LAB — CMP (CANCER CENTER ONLY)
ALT: 16 U/L (ref 0–44)
AST: 21 U/L (ref 15–41)
Albumin: 3.8 g/dL (ref 3.5–5.0)
Alkaline Phosphatase: 43 U/L (ref 38–126)
Anion gap: 12 (ref 5–15)
BUN: 35 mg/dL — ABNORMAL HIGH (ref 8–23)
CO2: 25 mmol/L (ref 22–32)
Calcium: 9.8 mg/dL (ref 8.9–10.3)
Chloride: 103 mmol/L (ref 98–111)
Creatinine: 1.56 mg/dL — ABNORMAL HIGH (ref 0.61–1.24)
GFR, Estimated: 47 mL/min — ABNORMAL LOW (ref >60)
Glucose, Bld: 98 mg/dL (ref 70–99)
Potassium: 4 mmol/L (ref 3.5–5.1)
Sodium: 140 mmol/L (ref 135–145)
Total Bilirubin: 0.8 mg/dL (ref 0.3–1.2)
Total Protein: 7.4 g/dL (ref 6.5–8.1)

## 2020-09-24 LAB — IRON AND TIBC
Iron: 121 ug/dL (ref 42–163)
Saturation Ratios: 38 % (ref 20–55)
TIBC: 320 ug/dL (ref 202–409)
UIBC: 200 ug/dL (ref 117–376)

## 2020-09-24 LAB — CBC WITH DIFFERENTIAL/PLATELET
Abs Immature Granulocytes: 0.01 10*3/uL (ref 0.00–0.07)
Basophils Absolute: 0 10*3/uL (ref 0.0–0.1)
Basophils Relative: 1 %
Eosinophils Absolute: 0.1 10*3/uL (ref 0.0–0.5)
Eosinophils Relative: 3 %
HCT: 41.8 % (ref 39.0–52.0)
Hemoglobin: 13.5 g/dL (ref 13.0–17.0)
Immature Granulocytes: 0 %
Lymphocytes Relative: 31 %
Lymphs Abs: 1.3 10*3/uL (ref 0.7–4.0)
MCH: 31.8 pg (ref 26.0–34.0)
MCHC: 32.3 g/dL (ref 30.0–36.0)
MCV: 98.6 fL (ref 80.0–100.0)
Monocytes Absolute: 0.7 10*3/uL (ref 0.1–1.0)
Monocytes Relative: 16 %
Neutro Abs: 2.1 10*3/uL (ref 1.7–7.7)
Neutrophils Relative %: 49 %
Platelets: 118 10*3/uL — ABNORMAL LOW (ref 150–400)
RBC: 4.24 MIL/uL (ref 4.22–5.81)
RDW: 14.4 % (ref 11.5–15.5)
WBC: 4.3 10*3/uL (ref 4.0–10.5)
nRBC: 0 % (ref 0.0–0.2)

## 2020-09-24 LAB — HEPATITIS PANEL, ACUTE
HCV Ab: REACTIVE — AB
Hep A IgM: NONREACTIVE
Hep B C IgM: NONREACTIVE
Hepatitis B Surface Ag: NONREACTIVE

## 2020-09-24 LAB — FERRITIN: Ferritin: 284 ng/mL (ref 24–336)

## 2020-09-24 LAB — VITAMIN B12: Vitamin B-12: 689 pg/mL (ref 180–914)

## 2020-09-24 NOTE — Assessment & Plan Note (Addendum)
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   He does not appear to have any definitive evidence of rheumatoid arthritis or other autoimmune diseases..  We have discussed about various causes of pancytopenia including but not limited to nutritional deficiencies, autoimmune diseases, alcohol use, medications, infections, bone marrow disorders.  He does not appear to have any new symptoms.  Physical examination unremarkable except for obesity.  He denies any recent infections, hospitalizations or new medications. I recommended repeating labs today including CBC, iron panel, Z79, folic acid, hepatitis panel and CMP.  He already had TSH which was unremarkable with his primary doctor. We have discussed about monitoring of the labs appear to be very stable versus doing a bone marrow biopsy if needed.   He is agreeable with the recommendations.  He will return to clinic in 3 to 4 weeks to review labs and to discuss any additional recommendations.

## 2020-09-24 NOTE — Progress Notes (Signed)
[facility] NOTE  Patient Care Team: Jani Gravel, MD as PCP - General (Internal Medicine)  CHIEF COMPLAINTS/PURPOSE OF CONSULTATION:  Pancytopenia.  ASSESSMENT & PLAN:  Other pancytopenia ([place]) This is a very pleasant 70 year old male patient with past medical history significant for hypertension, dyslipidemia, arthritis, type 2 diabetes mellitus referred to hematology for evaluation of pancytopenia.   He does not appear to have any definitive evidence of rheumatoid arthritis or other autoimmune diseases..  We have discussed about various causes of pancytopenia including but not limited to nutritional deficiencies, autoimmune diseases, alcohol use, medications, infections, bone marrow disorders.  He does not appear to have any new symptoms.  Physical examination unremarkable except for obesity.  He denies any recent infections, hospitalizations or new medications. I recommended repeating labs today including CBC, iron panel, K27, folic acid, hepatitis panel and CMP.  He already had TSH which was unremarkable with his primary doctor. We have discussed about monitoring of the labs appear to be very stable versus doing a bone marrow biopsy if needed.   He is agreeable with the recommendations.  He will return to clinic in 3 to 4 weeks to review labs and to discuss any additional recommendations.  Orders Placed This Encounter  Procedures  . CBC with Differential/Platelet    Standing Status:   Standing    Number of Occurrences:   22    Standing Expiration Date:   09/24/2021  . Pathologist smear review    Standing Status:   Future    Number of Occurrences:   1    Standing Expiration Date:   09/24/2021  . Iron and TIBC    Standing Status:   Future    Number of Occurrences:   1    Standing Expiration Date:   09/24/2021  . Ferritin    Standing Status:   Future    Number of Occurrences:   1    Standing Expiration Date:   09/24/2021  . Vitamin B12    Standing Status:    Future    Number of Occurrences:   1    Standing Expiration Date:   09/24/2021  . Folate RBC    Standing Status:   Future    Number of Occurrences:   1    Standing Expiration Date:   09/24/2021  . CMP ([place] only)    Standing Status:   Future    Number of Occurrences:   1    Standing Expiration Date:   09/24/2021  . Hepatitis panel, acute    Standing Status:   Future    Number of Occurrences:   1    Standing Expiration Date:   09/24/2021     HISTORY OF PRESENTING ILLNESS:  Lance Hausen Sr. 70 y.o. male is here because of  Pancytopenia.  This is a very pleasant 70 year old male patient past medical history significant for arthritis, type 2 diabetes, dyslipidemia and hypertension questionable rheumatoid arthritis for which she is not on any medication referred to hematology for evaluation of pancytopenia.  Mr. Zakariya denies any health complaints.  He has longstanding neuropathy as well as arthritis and he feels that his symptoms have stayed pretty much the same.  No recent new medications, viral infections or hospitalizations.  No B symptoms.  No changes in breathing, bowel habits, urinary habits.  No new neurological complaints.  No definitive diagnosis of rheumatoid arthritis although this has been listed in his chart. No known nutritional deficiencies. Rest of the pertinent  10 point ROS reviewed and unremarkable.  REVIEW OF SYSTEMS:   Constitutional: Denies fevers, chills or abnormal night sweats Eyes: Denies blurriness of vision, double vision or watery eyes Ears, nose, mouth, throat, and face: Denies mucositis or sore throat Respiratory: Denies cough, dyspnea or wheezes Cardiovascular: Denies palpitation, chest discomfort or lower extremity swelling Gastrointestinal:  Denies nausea, heartburn or change in bowel habits Skin: Denies abnormal skin rashes Lymphatics: Denies new lymphadenopathy or easy bruising Neurological: Has longstanding neuropathy in his legs   behavioral/Psych: Mood is stable, no new changes  All other systems were reviewed with the patient and are negative.  MEDICAL HISTORY:  Past Medical History:  Diagnosis Date  . Arthritis   . Back pain   . Diabetes mellitus without complication (HCC)    ORAL MEDS-NO INSULIN  . GERD (gastroesophageal reflux disease)    OCCAS AFTER EATING CERTAIN FOODS  . Hyperlipidemia   . Hypertension   . Joint pain   . Rheumatoid arthritis ([place])   . Shortness of breath   . Sleep apnea   . Ureteral stone    LEFT  --DISCOMFORT    SURGICAL HISTORY: Past Surgical History:  Procedure Laterality Date  . ANAL FISSURE REPAIR    . COLONOSCOPY WITH PROPOFOL N/A 03/02/2018   Procedure: COLONOSCOPY WITH PROPOFOL;  Surgeon: Carol Ada, MD;  Location: [facility];  Service: Endoscopy;  Laterality: N/A;  . CYSTOSCOPY WITH URETEROSCOPY  05/16/2012   Procedure: CYSTOSCOPY WITH URETEROSCOPY;  Surgeon: Molli Hazard, MD;  Location: [facility];  Service: Urology;  Laterality: Left;  left ureteral stent placement  . HOLMIUM LASER APPLICATION  02/58/5277   Procedure: HOLMIUM LASER APPLICATION;  Surgeon: Molli Hazard, MD;  Location: [facility];  Service: Urology;  Laterality: Left;  . KNEE ARTHROSCOPY     SEVERAL  SURGERIES BOTH KNEES  . POLYPECTOMY  03/02/2018   Procedure: POLYPECTOMY;  Surgeon: Carol Ada, MD;  Location: [facility];  Service: Endoscopy;;  . SUBMUCOSAL INJECTION  03/02/2018   Procedure: SUBMUCOSAL INJECTION saline;  Surgeon: Carol Ada, MD;  Location: [facility];  Service: Endoscopy;;  . VASCECTOMY      SOCIAL HISTORY: Social History   Socioeconomic History  . Marital status: Married    Spouse name: Not on file  . Number of children: Not on file  . Years of education: Not on file  . Highest education level: Not on file  Occupational History  . Not on file  Tobacco Use  . Smoking status: Former Research scientist (life sciences)  . Smokeless tobacco: Never Used  . Tobacco comment: QUIT  SMOKING MANY YRS AGO  Vaping Use  . Vaping Use: Never used  Substance and Sexual Activity  . Alcohol use: Yes    Comment: OCCAS  . Drug use: No  . Sexual activity: Not on file  Other Topics Concern  . Not on file  Social History Narrative   ** Merged History Encounter **       Social Determinants of Health   Financial Resource Strain: Not on file  Food Insecurity: Not on file  Transportation Needs: Not on file  Physical Activity: Not on file  Stress: Not on file  Social Connections: Not on file  Intimate Partner Violence: Not on file    FAMILY HISTORY: Family History  Problem Relation Age of Onset  . Hypertension Mother   . Obesity Father     ALLERGIES:  is allergic to vicodin [hydrocodone-acetaminophen] and codeine.  MEDICATIONS:  Current Outpatient Medications  Medication Sig  Dispense Refill  . amLODipine (NORVASC) 10 MG tablet Take 10 mg by mouth every morning.    Marland Kitchen aspirin EC 81 MG tablet Take 81 mg by mouth at bedtime.    . carvedilol (COREG) 3.125 MG tablet Take 3.125 mg by mouth 2 (two) times daily with a meal.     . Continuous Blood Gluc Receiver (FREESTYLE LIBRE 2 READER SYSTM) DEVI 1 each by Does not apply route daily. As directed 1 Device 0  . Continuous Blood Gluc Sensor (FREESTYLE LIBRE 14 DAY SENSOR) MISC 1 each by Does not apply route daily. Apply to skin for 14 days then remove 2 each 0  . doxazosin (CARDURA) 4 MG tablet Take 4 mg by mouth at bedtime.    . empagliflozin (JARDIANCE) 25 MG TABS tablet Take 25 mg by mouth daily.    . furosemide (LASIX) 40 MG tablet Take 40 mg by mouth daily as needed for fluid.    Marland Kitchen glipiZIDE (GLUCOTROL) 5 MG tablet Take 5 mg by mouth once.    Marland Kitchen losartan-hydrochlorothiazide (HYZAAR) 100-25 MG tablet Take 1 tablet by mouth daily.     . magnesium gluconate (MAGONATE) 500 MG tablet Take 500 mg by mouth 3 (three) times a week.    . Methylcellulose, Laxative, 500 MG TABS Take 500 mg by mouth daily.    . Multiple Vitamin  (MULTIVITAMIN WITH MINERALS) TABS Take 1 tablet by mouth daily.    . pravastatin (PRAVACHOL) 40 MG tablet Take 40 mg by mouth at bedtime.    Marland Kitchen VICTOZA 18 MG/3ML SOPN Inject 0.6 mg into the skin daily.   0  . vitamin B-12 (CYANOCOBALAMIN) 500 MCG tablet Take 500 mcg by mouth daily.     No current facility-administered medications for this visit.     PHYSICAL EXAMINATION: ECOG PERFORMANCE STATUS: 0 - Asymptomatic  Vitals:   09/24/20 1138  BP: (!) 143/60  Pulse: 73  Resp: 16  Temp: (!) 97.4 F (36.3 C)  SpO2: 97%   Filed Weights   09/24/20 1138  Weight: (!) 319 lb 14.4 oz (145.1 kg)    GENERAL:alert, no distress and comfortable, morbidly obese SKIN: skin color, texture, turgor are normal, no rashes or significant lesions EYES: normal, conjunctiva are pink and non-injected, sclera clear OROPHARYNX:no exudate, no erythema and lips, buccal mucosa, and tongue normal  NECK: supple, thyroid normal size, non-tender, without nodularity LYMPH:  no palpable lymphadenopathy in the cervical, axillary or inguinal LUNGS: clear to auscultation and percussion with normal breathing effort HEART: regular rate & rhythm and no murmurs and no lower extremity edema ABDOMEN: Limited exam in sitting position, no distention or tenderness.   Musculoskeletal:no cyanosis of digits and no clubbing  PSYCH: alert & oriented x 3 with fluent speech NEURO: no focal motor/sensory deficits  LABORATORY DATA:  I have reviewed the data as listed Lab Results  Component Value Date   WBC 4.3 09/24/2020   HGB 13.5 09/24/2020   HCT 41.8 09/24/2020   MCV 98.6 09/24/2020   PLT 118 (L) 09/24/2020     Chemistry      Component Value Date/Time   NA 140 09/24/2020 1221   NA 137 02/06/2019 0000   K 4.0 09/24/2020 1221   CL 103 09/24/2020 1221   CO2 25 09/24/2020 1221   BUN 35 (H) 09/24/2020 1221   BUN 36 (A) 02/06/2019 0000   CREATININE 1.56 (H) 09/24/2020 1221   GLU 105 02/06/2019 0000      Component Value  Date/Time  CALCIUM 9.8 09/24/2020 1221   ALKPHOS 43 09/24/2020 1221   AST 21 09/24/2020 1221   ALT 16 09/24/2020 1221   BILITOT 0.8 09/24/2020 1221     I reviewed labs from outside.  His white blood cell count was 3700 hemoglobin of 13.3 and platelet count 106,000 He was noted to have some monocytosis.  Creatinine noted to be 1.6.  Otherwise calcium, total protein and alk phos are normal.  TSH is normal hemoglobin A1c is 5.5, vitamin D of 31.4.  PSA of 0.25.  CBC in September 2020 also showed a platelet count of 111,000 Back in 2013, his white blood cell count was normal, his platelet count was 212,000 and his hemoglobin was normal.  RADIOGRAPHIC STUDIES: I have personally reviewed the radiological images as listed and agreed with the findings in the report. No results found.  All questions were answered. The patient knows to call the clinic with any problems, questions or concerns. I spent 45 minutes in the care of this patient including H and P, review of records, counseling and coordination of care.     Benay Pike, MD 09/24/2020 2:06 PM

## 2020-09-24 NOTE — Telephone Encounter (Signed)
Scheduled follow-up appointment per 3/24 los. Patient is aware. 

## 2020-09-25 ENCOUNTER — Other Ambulatory Visit: Payer: Self-pay | Admitting: Hematology and Oncology

## 2020-09-25 DIAGNOSIS — D61818 Other pancytopenia: Secondary | ICD-10-CM

## 2020-09-25 LAB — FOLATE RBC
Folate, Hemolysate: 579 ng/mL
Folate, RBC: 1399 ng/mL (ref >498)
Hematocrit: 41.4 % (ref 37.5–51.0)

## 2020-09-25 LAB — PATHOLOGIST SMEAR REVIEW

## 2020-09-28 DIAGNOSIS — N189 Chronic kidney disease, unspecified: Secondary | ICD-10-CM | POA: Diagnosis not present

## 2020-09-28 DIAGNOSIS — N1832 Chronic kidney disease, stage 3b: Secondary | ICD-10-CM | POA: Diagnosis not present

## 2020-10-15 ENCOUNTER — Encounter: Payer: Self-pay | Admitting: Hematology and Oncology

## 2020-10-15 ENCOUNTER — Other Ambulatory Visit: Payer: Self-pay

## 2020-10-15 ENCOUNTER — Inpatient Hospital Stay: Payer: Medicare HMO | Attending: Hematology and Oncology | Admitting: Hematology and Oncology

## 2020-10-15 ENCOUNTER — Telehealth: Payer: Self-pay | Admitting: Hematology and Oncology

## 2020-10-15 ENCOUNTER — Inpatient Hospital Stay: Payer: Medicare HMO

## 2020-10-15 VITALS — BP 125/73 | HR 93 | Temp 97.6°F | Resp 19 | Ht 68.5 in | Wt 325.8 lb

## 2020-10-15 DIAGNOSIS — E1122 Type 2 diabetes mellitus with diabetic chronic kidney disease: Secondary | ICD-10-CM | POA: Insufficient documentation

## 2020-10-15 DIAGNOSIS — D61818 Other pancytopenia: Secondary | ICD-10-CM | POA: Insufficient documentation

## 2020-10-15 DIAGNOSIS — I129 Hypertensive chronic kidney disease with stage 1 through stage 4 chronic kidney disease, or unspecified chronic kidney disease: Secondary | ICD-10-CM | POA: Insufficient documentation

## 2020-10-15 DIAGNOSIS — Z7984 Long term (current) use of oral hypoglycemic drugs: Secondary | ICD-10-CM | POA: Insufficient documentation

## 2020-10-15 DIAGNOSIS — Z87891 Personal history of nicotine dependence: Secondary | ICD-10-CM | POA: Diagnosis not present

## 2020-10-15 DIAGNOSIS — N189 Chronic kidney disease, unspecified: Secondary | ICD-10-CM | POA: Diagnosis not present

## 2020-10-15 LAB — CBC WITH DIFFERENTIAL/PLATELET
Abs Immature Granulocytes: 0.01 10*3/uL (ref 0.00–0.07)
Basophils Absolute: 0 10*3/uL (ref 0.0–0.1)
Basophils Relative: 1 %
Eosinophils Absolute: 0.2 10*3/uL (ref 0.0–0.5)
Eosinophils Relative: 3 %
HCT: 38.8 % — ABNORMAL LOW (ref 39.0–52.0)
Hemoglobin: 12.9 g/dL — ABNORMAL LOW (ref 13.0–17.0)
Immature Granulocytes: 0 %
Lymphocytes Relative: 28 %
Lymphs Abs: 1.6 10*3/uL (ref 0.7–4.0)
MCH: 31.9 pg (ref 26.0–34.0)
MCHC: 33.2 g/dL (ref 30.0–36.0)
MCV: 95.8 fL (ref 80.0–100.0)
Monocytes Absolute: 0.9 10*3/uL (ref 0.1–1.0)
Monocytes Relative: 16 %
Neutro Abs: 2.9 10*3/uL (ref 1.7–7.7)
Neutrophils Relative %: 52 %
Platelets: 108 10*3/uL — ABNORMAL LOW (ref 150–400)
RBC: 4.05 MIL/uL — ABNORMAL LOW (ref 4.22–5.81)
RDW: 14.1 % (ref 11.5–15.5)
WBC: 5.5 10*3/uL (ref 4.0–10.5)
nRBC: 0 % (ref 0.0–0.2)

## 2020-10-15 LAB — APTT: aPTT: 27 seconds (ref 24–36)

## 2020-10-15 LAB — PROTIME-INR
INR: 1 (ref 0.8–1.2)
Prothrombin Time: 13.7 seconds (ref 11.4–15.2)

## 2020-10-15 LAB — RETICULOCYTES
Immature Retic Fract: 22 % — ABNORMAL HIGH (ref 2.3–15.9)
RBC.: 4.04 MIL/uL — ABNORMAL LOW (ref 4.22–5.81)
Retic Count, Absolute: 73.1 10*3/uL (ref 19.0–186.0)
Retic Ct Pct: 1.8 % (ref 0.4–3.1)

## 2020-10-15 LAB — LACTATE DEHYDROGENASE: LDH: 203 U/L — ABNORMAL HIGH (ref 98–192)

## 2020-10-15 NOTE — Progress Notes (Signed)
[facility] CONSULT NOTE  Patient Care Team: Kim, James, MD as PCP - General (Internal Medicine)  CHIEF COMPLAINTS/PURPOSE OF CONSULTATION:  Pancytopenia.  ASSESSMENT & PLAN:  Other pancytopenia (HCC) This is a very pleasant 70-year-old male patient with past medical history significant for hypertension, dyslipidemia, arthritis, type 2 diabetes mellitus referred to hematology for evaluation of pancytopenia.   He does not appear to have any definitive evidence of rheumatoid arthritis or other autoimmune diseases..  W he e have discussed about various causes of pancytopenia including but not limited to nutritional deficiencies, autoimmune diseases, alcohol use, medications, infections, bone marrow disorders.   I recommended repeating labs today including CBC, iron panel, B12, folic acid, hepatitis panel and CMP. He is here for follow-up on his labs.  No concern for nutritional deficiency.  He did test positive for hepatitis C antibodies.  Rest of the lab review showed correction of leukopenia and anemia.  He continues to have mild thrombocytopenia. We do not have any labs between 2013 and 2020 to assess the chronicity of thrombocytopenia for this patient.  We have discussed about monitoring versus proceeding with bone marrow aspiration and biopsy.  We will also have to draw HCV testing given positive antibodies. We will also draw his rest of the labs for thrombocytopenia.  If none of these labs are worrisome, he can FU with us in 3 months. He is not interested in pursuing BMB at this time as well since this may not necessarily change the management. He was also encouraged to FU with his PCP for HCV study results.  Orders Placed This Encounter  Procedures  . HCV RNA quant rflx ultra or genotyp    Standing Status:   Future    Standing Expiration Date:   10/15/2021     HISTORY OF PRESENTING ILLNESS:  Lance Gregory Sr. 70 y.o. male is here because of  Pancytopenia.  This is a  very pleasant 70-year-old male patient past medical history significant for arthritis, type 2 diabetes, dyslipidemia and hypertension questionable rheumatoid arthritis for which he  is not on any medication referred to hematology for evaluation of pancytopenia.    Interval History He is here for FU by himself. He is doing well except for his joint pain, arthritis and weight gain He gained about 6 lbs since his last visit, he said No change in breathing, bowel habits, urinary habits or neurological complaints. No medication changes. Rest of the pertinent 10 point ROS reviewed and unremarkable.  REVIEW OF SYSTEMS:   Constitutional: Denies fevers, chills or abnormal night sweats Eyes: Denies blurriness of vision, double vision or watery eyes Ears, nose, mouth, throat, and face: Denies mucositis or sore throat Respiratory: Denies cough, dyspnea or wheezes Cardiovascular: Denies palpitation, chest discomfort or lower extremity swelling Gastrointestinal:  Denies nausea, heartburn or change in bowel habits Skin: Denies abnormal skin rashes Lymphatics: Denies new lymphadenopathy or easy bruising Neurological: Has longstanding neuropathy in his legs  behavioral/Psych: Mood is stable, no new changes  All other systems were reviewed with the patient and are negative.  MEDICAL HISTORY:  Past Medical History:  Diagnosis Date  . Arthritis   . Back pain   . Diabetes mellitus without complication (HCC)    ORAL MEDS-NO INSULIN  . GERD (gastroesophageal reflux disease)    OCCAS AFTER EATING CERTAIN FOODS  . Hyperlipidemia   . Hypertension   . Joint pain   . Rheumatoid arthritis (HCC)   . Shortness of breath   . Sleep   apnea   . Ureteral stone    LEFT  --DISCOMFORT    SURGICAL HISTORY: Past Surgical History:  Procedure Laterality Date  . ANAL FISSURE REPAIR    . COLONOSCOPY WITH PROPOFOL N/A 03/02/2018   Procedure: COLONOSCOPY WITH PROPOFOL;  Surgeon: Hung, Patrick, MD;  Location: [facility];  Service: Endoscopy;  Laterality: N/A;  . CYSTOSCOPY WITH URETEROSCOPY  05/16/2012   Procedure: CYSTOSCOPY WITH URETEROSCOPY;  Surgeon: Daniel Young Woodruff, MD;  Location: [facility];  Service: Urology;  Laterality: Left;  left ureteral stent placement  . HOLMIUM LASER APPLICATION  05/16/2012   Procedure: HOLMIUM LASER APPLICATION;  Surgeon: Daniel Young Woodruff, MD;  Location: [facility];  Service: Urology;  Laterality: Left;  . KNEE ARTHROSCOPY     SEVERAL  SURGERIES BOTH KNEES  . POLYPECTOMY  03/02/2018   Procedure: POLYPECTOMY;  Surgeon: Hung, Patrick, MD;  [facility];  Service: Endoscopy;;  . SUBMUCOSAL INJECTION  03/02/2018   Procedure: SUBMUCOSAL INJECTION saline;  Surgeon: Hung, Patrick, MD;  Location[facility];  Service: Endoscopy;;  . VASCECTOMY      SOCIAL HISTORY: Social History   Socioeconomic History  . Marital status: Married    Spouse name: Not on file  . Number of children: Not on file  . Years of education: Not on file  . Highest education level: Not on file  Occupational History  . Not on file  Tobacco Use  . Smoking status: Former Smoker  . Smokeless tobacco: Never Used  . Tobacco comment: QUIT SMOKING MANY YRS AGO  Vaping Use  . Vaping Use: Never used  Substance and Sexual Activity  . Alcohol use: Yes    Comment: OCCAS  . Drug use: No  . Sexual activity: Not on file  Other Topics Concern  . Not on file  Social History Narrative   ** Merged History Encounter **       Social Determinants of Health   Financial Resource Strain: Not on file  Food Insecurity: Not on file  Transportation Needs: Not on file  Physical Activity: Not on file  Stress: Not on file  Social Connections: Not on file  Intimate Partner Violence: Not on file    FAMILY HISTORY: Family History  Problem Relation Age of Onset  . Hypertension Mother   . Obesity Father     ALLERGIES:  is allergic to vicodin [hydrocodone-acetaminophen] and  codeine.  MEDICATIONS:  Current Outpatient Medications  Medication Sig Dispense Refill  . amLODipine (NORVASC) 10 MG tablet Take 10 mg by mouth every morning.    . aspirin EC 81 MG tablet Take 81 mg by mouth at bedtime.    . carvedilol (COREG) 3.125 MG tablet Take 3.125 mg by mouth 2 (two) times daily with a meal.     . Continuous Blood Gluc Receiver (FREESTYLE LIBRE 2 READER SYSTM) DEVI 1 each by Does not apply route daily. As directed 1 Device 0  . Continuous Blood Gluc Sensor (FREESTYLE LIBRE 14 DAY SENSOR) MISC 1 each by Does not apply route daily. Apply to skin for 14 days then remove 2 each 0  . doxazosin (CARDURA) 4 MG tablet Take 4 mg by mouth at bedtime.    . empagliflozin (JARDIANCE) 25 MG TABS tablet Take 25 mg by mouth daily.    . furosemide (LASIX) 40 MG tablet Take 40 mg by mouth daily as needed for fluid.    . glipiZIDE (GLUCOTROL) 5 MG tablet Take 5 mg by mouth once.    .   losartan-hydrochlorothiazide (HYZAAR) 100-25 MG tablet Take 1 tablet by mouth daily.     . magnesium gluconate (MAGONATE) 500 MG tablet Take 500 mg by mouth 3 (three) times a week.    . Methylcellulose, Laxative, 500 MG TABS Take 500 mg by mouth daily.    . Multiple Vitamin (MULTIVITAMIN WITH MINERALS) TABS Take 1 tablet by mouth daily.    . pravastatin (PRAVACHOL) 40 MG tablet Take 40 mg by mouth at bedtime.    . VICTOZA 18 MG/3ML SOPN Inject 0.6 mg into the skin daily.   0  . vitamin B-12 (CYANOCOBALAMIN) 500 MCG tablet Take 500 mcg by mouth daily.     No current facility-administered medications for this visit.     PHYSICAL EXAMINATION: ECOG PERFORMANCE STATUS: 0 - Asymptomatic  Vitals:   10/15/20 1436  BP: 125/73  Pulse: 93  Resp: 19  Temp: 97.6 F (36.4 C)  SpO2: 93%   Filed Weights   10/15/20 1436  Weight: (!) 325 lb 12.8 oz (147.8 kg)    GENERAL:alert, no distress and comfortable, morbidly obese SKIN: skin color, texture, turgor are normal, no rashes or significant lesions EYES:  normal, conjunctiva are pink and non-injected, sclera clear OROPHARYNX:no exudate, no erythema and lips, buccal mucosa, and tongue normal  NECK: supple, thyroid normal size, non-tender, without nodularity LYMPH:  no palpable lymphadenopathy in the cervical,  LUNGS: clear to auscultation and percussion with normal breathing effort HEART: regular rate & rhythm and no murmurs and no lower extremity edema ABDOMEN: Limited exam in sitting position, no distention or tenderness.   Musculoskeletal:no cyanosis of digits and no clubbing  PSYCH: alert & oriented x 3 with fluent speech NEURO: no focal motor/sensory deficits  LABORATORY DATA:  I have reviewed the data as listed Lab Results  Component Value Date   WBC 4.3 09/24/2020   HGB 13.5 09/24/2020   HCT 41.4 09/24/2020   MCV 98.6 09/24/2020   PLT 118 (L) 09/24/2020     Chemistry      Component Value Date/Time   NA 140 09/24/2020 1221   NA 137 02/06/2019 0000   K 4.0 09/24/2020 1221   CL 103 09/24/2020 1221   CO2 25 09/24/2020 1221   BUN 35 (H) 09/24/2020 1221   BUN 36 (A) 02/06/2019 0000   CREATININE 1.56 (H) 09/24/2020 1221   GLU 105 02/06/2019 0000      Component Value Date/Time   CALCIUM 9.8 09/24/2020 1221   ALKPHOS 43 09/24/2020 1221   AST 21 09/24/2020 1221   ALT 16 09/24/2020 1221   BILITOT 0.8 09/24/2020 1221     I reviewed labs from outside.  His white blood cell count was 3700 hemoglobin of 13.3 and platelet count 106,000 He was noted to have some monocytosis.  Creatinine noted to be 1.6.  Otherwise calcium, total protein and alk phos are normal.  TSH is normal hemoglobin A1c is 5.5, vitamin D of 31.4.  PSA of 0.25.  CBC in September 2020 also showed a platelet count of 111,000 Back in 2013, his white blood cell count was normal, his platelet count was 212,000 and his hemoglobin was normal.  Folate leves normal He has Hep C antibodies. CKD which is stable essentially B12 is normal Ferritin 284 Most recent CBC  with normal WBC, hemoglobin and mildly low platelets Again, we dont have labs between 2013 and 2020 to compare and assess the chronicity of thrombocytopenia.  RADIOGRAPHIC STUDIES: I have personally reviewed the radiological images as listed and   agreed with the findings in the report. No results found.  All questions were answered. The patient knows to call the clinic with any problems, questions or concerns. I spent  20 minutes in the care of this patient including H and P, review of records, counseling and coordination of care.     Praveena Iruku, MD 10/15/2020 2:57 PM   

## 2020-10-15 NOTE — Assessment & Plan Note (Addendum)
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   He does not appear to have any definitive evidence of rheumatoid arthritis or other autoimmune diseases..  W he e have discussed about various causes of pancytopenia including but not limited to nutritional deficiencies, autoimmune diseases, alcohol use, medications, infections, bone marrow disorders.   I recommended repeating labs today including CBC, iron panel, Q65, folic acid, hepatitis panel and CMP. He is here for follow-up on his labs.  No concern for nutritional deficiency.  He did test positive for hepatitis C antibodies.  Rest of the lab review showed correction of leukopenia and anemia.  He continues to have mild thrombocytopenia. We do not have any labs between 2013 and 2020 to assess the chronicity of thrombocytopenia for this patient.  We have discussed about monitoring versus proceeding with bone marrow aspiration and biopsy.  We will also have to draw HCV testing given positive antibodies. We will also draw his rest of the labs for thrombocytopenia.  If none of these labs are worrisome, he can FU with [place] in 3 months. He is not interested in pursuing BMB at this time as well since this may not necessarily change the management. He was also encouraged to FU with his PCP for HCV study results.

## 2020-10-15 NOTE — Telephone Encounter (Signed)
Scheduled per 4/14 los, Patient received update calender.

## 2020-10-16 LAB — HCV RNA QUANT RFLX ULTRA OR GENOTYP
HCV RNA Qnt(log copy/mL): UNDETERMINED log10 IU/mL
HepC Qn: NOT DETECTED IU/mL

## 2020-10-27 ENCOUNTER — Ambulatory Visit: Payer: Medicare HMO | Attending: Internal Medicine

## 2020-10-27 ENCOUNTER — Other Ambulatory Visit (HOSPITAL_BASED_OUTPATIENT_CLINIC_OR_DEPARTMENT_OTHER): Payer: Self-pay

## 2020-10-27 DIAGNOSIS — Z23 Encounter for immunization: Secondary | ICD-10-CM

## 2020-10-27 MED ORDER — PFIZER-BIONT COVID-19 VAC-TRIS 30 MCG/0.3ML IM SUSP
INTRAMUSCULAR | 0 refills | Status: AC
  Filled 2020-10-27: qty 0.3, 1d supply, fill #0

## 2020-10-27 NOTE — Progress Notes (Signed)
   [id number]  Name:  Lance Petta Sr.    MRN: [id number] DOB: 02-Jul-1951  10/27/2020  [name] was observed post Covid-19 immunization for 15 minutes without incident. He was provided with Vaccine Information Sheet and instruction to access the V-Safe system.   [name] was instructed to call 911 with any severe reactions post vaccine: Marland Kitchen Difficulty breathing  . Swelling of face and throat  . A fast heartbeat  . A bad rash all over body  . Dizziness and weakness   Immunizations Administered    Name Date Dose VIS Date Route   PFIZER Comrnaty(Gray TOP) Covid-19 Vaccine 10/27/2020 11:10 AM 0.3 mL 06/11/2020 Intramuscular   Manufacturer: [employer], [employer]   Lot: [id number]   NDC: [id number]

## 2020-11-04 DIAGNOSIS — H40013 Open angle with borderline findings, low risk, bilateral: Secondary | ICD-10-CM | POA: Diagnosis not present

## 2020-11-16 ENCOUNTER — Ambulatory Visit: Payer: Medicare HMO | Admitting: Orthopaedic Surgery

## 2020-11-16 DIAGNOSIS — D631 Anemia in chronic kidney disease: Secondary | ICD-10-CM | POA: Diagnosis not present

## 2020-11-16 DIAGNOSIS — N1832 Chronic kidney disease, stage 3b: Secondary | ICD-10-CM | POA: Diagnosis not present

## 2020-11-16 DIAGNOSIS — I129 Hypertensive chronic kidney disease with stage 1 through stage 4 chronic kidney disease, or unspecified chronic kidney disease: Secondary | ICD-10-CM | POA: Diagnosis not present

## 2020-11-16 DIAGNOSIS — R600 Localized edema: Secondary | ICD-10-CM | POA: Diagnosis not present

## 2020-11-16 DIAGNOSIS — Z87442 Personal history of urinary calculi: Secondary | ICD-10-CM | POA: Diagnosis not present

## 2020-12-08 DIAGNOSIS — H401131 Primary open-angle glaucoma, bilateral, mild stage: Secondary | ICD-10-CM | POA: Diagnosis not present

## 2021-01-14 ENCOUNTER — Telehealth: Payer: Self-pay

## 2021-01-14 ENCOUNTER — Other Ambulatory Visit: Payer: Self-pay

## 2021-01-14 ENCOUNTER — Inpatient Hospital Stay: Payer: Medicare HMO

## 2021-01-14 ENCOUNTER — Inpatient Hospital Stay: Payer: Medicare HMO | Attending: Hematology and Oncology | Admitting: Hematology and Oncology

## 2021-01-14 ENCOUNTER — Encounter: Payer: Self-pay | Admitting: Hematology and Oncology

## 2021-01-14 VITALS — BP 122/53 | HR 74 | Temp 99.1°F | Resp 18 | Wt 318.8 lb

## 2021-01-14 DIAGNOSIS — E1140 Type 2 diabetes mellitus with diabetic neuropathy, unspecified: Secondary | ICD-10-CM | POA: Diagnosis not present

## 2021-01-14 DIAGNOSIS — Z79899 Other long term (current) drug therapy: Secondary | ICD-10-CM | POA: Insufficient documentation

## 2021-01-14 DIAGNOSIS — D696 Thrombocytopenia, unspecified: Secondary | ICD-10-CM | POA: Diagnosis not present

## 2021-01-14 DIAGNOSIS — Z7984 Long term (current) use of oral hypoglycemic drugs: Secondary | ICD-10-CM | POA: Insufficient documentation

## 2021-01-14 DIAGNOSIS — D61818 Other pancytopenia: Secondary | ICD-10-CM

## 2021-01-14 DIAGNOSIS — E1122 Type 2 diabetes mellitus with diabetic chronic kidney disease: Secondary | ICD-10-CM | POA: Diagnosis not present

## 2021-01-14 DIAGNOSIS — Z87891 Personal history of nicotine dependence: Secondary | ICD-10-CM | POA: Insufficient documentation

## 2021-01-14 DIAGNOSIS — Z7289 Other problems related to lifestyle: Secondary | ICD-10-CM | POA: Diagnosis not present

## 2021-01-14 LAB — CBC WITH DIFFERENTIAL/PLATELET
Abs Immature Granulocytes: 0 10*3/uL (ref 0.00–0.07)
Basophils Absolute: 0 10*3/uL (ref 0.0–0.1)
Basophils Relative: 1 %
Eosinophils Absolute: 0.2 10*3/uL (ref 0.0–0.5)
Eosinophils Relative: 4 %
HCT: 35.4 % — ABNORMAL LOW (ref 39.0–52.0)
Hemoglobin: 11.6 g/dL — ABNORMAL LOW (ref 13.0–17.0)
Immature Granulocytes: 0 %
Lymphocytes Relative: 38 %
Lymphs Abs: 1.3 10*3/uL (ref 0.7–4.0)
MCH: 32.4 pg (ref 26.0–34.0)
MCHC: 32.8 g/dL (ref 30.0–36.0)
MCV: 98.9 fL (ref 80.0–100.0)
Monocytes Absolute: 0.7 10*3/uL (ref 0.1–1.0)
Monocytes Relative: 18 %
Neutro Abs: 1.4 10*3/uL — ABNORMAL LOW (ref 1.7–7.7)
Neutrophils Relative %: 39 %
Platelets: 107 10*3/uL — ABNORMAL LOW (ref 150–400)
RBC: 3.58 MIL/uL — ABNORMAL LOW (ref 4.22–5.81)
RDW: 14.7 % (ref 11.5–15.5)
WBC: 3.6 10*3/uL — ABNORMAL LOW (ref 4.0–10.5)
nRBC: 0.6 % — ABNORMAL HIGH (ref 0.0–0.2)

## 2021-01-14 LAB — COMPREHENSIVE METABOLIC PANEL
ALT: 26 U/L (ref 0–44)
AST: 36 U/L (ref 15–41)
Albumin: 3.3 g/dL — ABNORMAL LOW (ref 3.5–5.0)
Alkaline Phosphatase: 50 U/L (ref 38–126)
Anion gap: 11 (ref 5–15)
BUN: 31 mg/dL — ABNORMAL HIGH (ref 8–23)
CO2: 29 mmol/L (ref 22–32)
Calcium: 9.8 mg/dL (ref 8.9–10.3)
Chloride: 100 mmol/L (ref 98–111)
Creatinine, Ser: 1.96 mg/dL — ABNORMAL HIGH (ref 0.61–1.24)
GFR, Estimated: 36 mL/min — ABNORMAL LOW (ref >60)
Glucose, Bld: 162 mg/dL — ABNORMAL HIGH (ref 70–99)
Potassium: 4 mmol/L (ref 3.5–5.1)
Sodium: 140 mmol/L (ref 135–145)
Total Bilirubin: 0.6 mg/dL (ref 0.3–1.2)
Total Protein: 6.8 g/dL (ref 6.5–8.1)

## 2021-01-14 NOTE — Telephone Encounter (Signed)
-----   Message from Benay Pike, MD sent at 01/14/2021  3:08 PM EDT ----- Please let him know that all his blood counts are slightly lower, alcohol cessation might help. We will see him back in 3 months for FU.

## 2021-01-14 NOTE — Telephone Encounter (Signed)
Patient called regarding recent lab results. Provided patient with Dr. Rob Hickman recommendations and he verbalized an understanding of the information.

## 2021-01-14 NOTE — Progress Notes (Signed)
[facility] NOTE  Patient Care Team: Jani Gravel, MD as PCP - General (Internal Medicine)  CHIEF COMPLAINTS/PURPOSE OF CONSULTATION:  Pancytopenia.  ASSESSMENT & PLAN:   No problem-specific Assessment & Plan notes found for this encounter.  Orders Placed This Encounter  Procedures   CBC with Differential/Platelet    Standing Status:   Standing    Number of Occurrences:   22    Standing Expiration Date:   01/14/2022   Comprehensive metabolic panel    Standing Status:   Standing    Number of Occurrences:   33    Standing Expiration Date:   01/14/2022   CBC with Differential/Platelet   CMP ([place] only)    This is a very pleasant 70 year old male patient with past medical history significant for hypertension, dyslipidemia, arthritis, type 2 diabetes mellitus referred to hematology for evaluation of pancytopenia.   He does not appear to have any definitive evidence of rheumatoid arthritis or other autoimmune diseases..  W he e have discussed about various causes of pancytopenia including but not limited to nutritional deficiencies, autoimmune diseases, alcohol use, medications, infections, bone marrow disorders.   I recommended repeating labs today including CBC, iron panel, D17, folic acid, hepatitis panel and CMP. No concern for nutritional deficiency.  He did test positive for hepatitis C antibodies.  Rest of the lab review showed correction of leukopenia and anemia.  He continues to have mild thrombocytopenia. We do not have any labs between 2013 and 2020 to assess the chronicity of thrombocytopenia for this patient.  We have discussed about monitoring versus proceeding with bone marrow aspiration and biopsy. He was interested in follow-up.  Since last visit he denies any new complaints except for fatigue, arthritis and diabetic neuropathy in his legs.  He continues to drink 2 shots of gin or whiskey every day. I wonder if his cytopenias are indeed related  to alcohol, recommended him to stop alcohol consumption, repeat labs today and return to clinic in 3 to 4 months for reevaluation. If his labs show stable cytopenias, I would rather monitor.  HISTORY OF PRESENTING ILLNESS:   Lance Hausen Sr. 70 y.o. male is here because of pancytopenia.  This is a very pleasant 70 year old male patient past medical history significant for arthritis, type 2 diabetes, dyslipidemia and hypertension questionable rheumatoid arthritis referred to hematology for evaluation of pancytopenia.    Interval History He is here for FU by himself. He is doing well except for his joint pain, arthritis and neuropathy in his feet  His weight has been stable since last visit. He has been drinking couple glasses of vodka or whiskey or gin every day, he would like to try quitting it, has not had a drink in the past 1 week.  Besides alcohol consumption, he denies any new health complaints. He was hoping to take some medication for the neuropathy, will have checked with his PCP. No change in bowel habits or urinary habits. Rest of the pertinent 10 point ROS reviewed and unremarkable.  REVIEW OF SYSTEMS:   Constitutional: Denies fevers, chills or abnormal night sweats Eyes: Denies blurriness of vision, double vision or watery eyes Ears, nose, mouth, throat, and face: Denies mucositis or sore throat Respiratory: Denies cough, dyspnea or wheezes Cardiovascular: Denies palpitation, chest discomfort or lower extremity swelling Gastrointestinal:  Denies nausea, heartburn or change in bowel habits Skin: Denies abnormal skin rashes Lymphatics: Denies new lymphadenopathy or easy bruising Neurological: Has longstanding neuropathy in his legs  behavioral/Psych: Mood is stable, no new changes  All other systems were reviewed with the patient and are negative.  MEDICAL HISTORY:  Past Medical History:  Diagnosis Date   Arthritis    Back pain    Diabetes mellitus without complication  (HCC)    ORAL MEDS-NO INSULIN   GERD (gastroesophageal reflux disease)    OCCAS AFTER EATING CERTAIN FOODS   Hyperlipidemia    Hypertension    Joint pain    Rheumatoid arthritis ([place])    Shortness of breath    Sleep apnea    Ureteral stone    LEFT  --DISCOMFORT    SURGICAL HISTORY: Past Surgical History:  Procedure Laterality Date   ANAL FISSURE REPAIR     COLONOSCOPY WITH PROPOFOL N/A 03/02/2018   Procedure: COLONOSCOPY WITH PROPOFOL;  Surgeon: Carol Ada, MD;  Location: [facility];  Service: Endoscopy;  Laterality: N/A;   CYSTOSCOPY WITH URETEROSCOPY  05/16/2012   Procedure: CYSTOSCOPY WITH URETEROSCOPY;  Surgeon: Molli Hazard, MD;  Location: [facility];  Service: Urology;  Laterality: Left;  left ureteral stent placement   HOLMIUM LASER APPLICATION  40/34/7425   Procedure: HOLMIUM LASER APPLICATION;  Surgeon: Molli Hazard, MD;  Location: [facility];  Service: Urology;  Laterality: Left;   KNEE ARTHROSCOPY     SEVERAL  SURGERIES BOTH KNEES   POLYPECTOMY  03/02/2018   Procedure: POLYPECTOMY;  Surgeon: Carol Ada, MD;  Location: [facility];  Service: Endoscopy;;   SUBMUCOSAL INJECTION  03/02/2018   Procedure: SUBMUCOSAL INJECTION saline;  Surgeon: Carol Ada, MD;  Location: [facility];  Service: Endoscopy;;   VASCECTOMY      SOCIAL HISTORY: Social History   Socioeconomic History   Marital status: Married    Spouse name: Not on file   Number of children: Not on file   Years of education: Not on file   Highest education level: Not on file  Occupational History   Not on file  Tobacco Use   Smoking status: Former   Smokeless tobacco: Never   Tobacco comments:    QUIT SMOKING MANY YRS AGO  Vaping Use   Vaping Use: Never used  Substance and Sexual Activity   Alcohol use: Yes    Comment: OCCAS   Drug use: No   Sexual activity: Not on file  Other Topics Concern   Not on file  Social History Narrative   ** Merged History Encounter **        Social Determinants of Health   Financial Resource Strain: Not on file  Food Insecurity: Not on file  Transportation Needs: Not on file  Physical Activity: Not on file  Stress: Not on file  Social Connections: Not on file  Intimate Partner Violence: Not on file    FAMILY HISTORY: Family History  Problem Relation Age of Onset   Hypertension Mother    Obesity Father     ALLERGIES:  is allergic to vicodin [hydrocodone-acetaminophen] and codeine.  MEDICATIONS:  Current Outpatient Medications  Medication Sig Dispense Refill   amLODipine (NORVASC) 10 MG tablet Take 10 mg by mouth every morning.     aspirin EC 81 MG tablet Take 81 mg by mouth at bedtime.     carvedilol (COREG) 3.125 MG tablet Take 3.125 mg by mouth 2 (two) times daily with a meal.      Continuous Blood Gluc Receiver (FREESTYLE LIBRE 2 READER SYSTM) DEVI 1 each by Does not apply route daily. As directed 1 Device 0   Continuous Blood  Gluc Sensor (FREESTYLE LIBRE 14 DAY SENSOR) MISC 1 each by Does not apply route daily. Apply to skin for 14 days then remove 2 each 0   COVID-19 mRNA Vac-TriS, Pfizer, (PFIZER-BIONT COVID-19 VAC-TRIS) SUSP injection Inject into the muscle. 0.3 mL 0   doxazosin (CARDURA) 4 MG tablet Take 4 mg by mouth at bedtime.     empagliflozin (JARDIANCE) 25 MG TABS tablet Take 25 mg by mouth daily.     furosemide (LASIX) 40 MG tablet Take 40 mg by mouth daily as needed for fluid.     glipiZIDE (GLUCOTROL) 5 MG tablet Take 5 mg by mouth once.     losartan-hydrochlorothiazide (HYZAAR) 100-25 MG tablet Take 1 tablet by mouth daily.      magnesium gluconate (MAGONATE) 500 MG tablet Take 500 mg by mouth 3 (three) times a week.     Methylcellulose, Laxative, 500 MG TABS Take 500 mg by mouth daily.     Multiple Vitamin (MULTIVITAMIN WITH MINERALS) TABS Take 1 tablet by mouth daily.     pravastatin (PRAVACHOL) 40 MG tablet Take 40 mg by mouth at bedtime.     VICTOZA 18 MG/3ML SOPN Inject 0.6 mg into the skin  daily.   0   vitamin B-12 (CYANOCOBALAMIN) 500 MCG tablet Take 500 mcg by mouth daily.     No current facility-administered medications for this visit.     PHYSICAL EXAMINATION: ECOG PERFORMANCE STATUS: 0 - Asymptomatic  Vitals:   01/14/21 1229  BP: (!) 122/53  Pulse: 74  Resp: 18  Temp: 99.1 F (37.3 C)  SpO2: 95%    Filed Weights   01/14/21 1229  Weight: (!) 318 lb 12.8 oz (144.6 kg)    GENERAL:alert, no distress and comfortable, morbidly obese NECK: supple, no palpable lymphadenopathy LUNGS: clear to auscultation and percussion with normal breathing effort HEART: regular rate & rhythm and no murmurs and no lower extremity edema ABDOMEN: Limited exam in sitting position, no distention or tenderness.   Musculoskeletal:no cyanosis of digits and no clubbing.  Bilateral lower extremity 1-2+ edema which is chronic according to the patient. PSYCH: alert & oriented x 3 with fluent speech NEURO: no focal motor/sensory deficits  LABORATORY DATA:  I have reviewed the data as listed Lab Results  Component Value Date   WBC 5.5 10/15/2020   HGB 12.9 (L) 10/15/2020   HCT 38.8 (L) 10/15/2020   MCV 95.8 10/15/2020   PLT 108 (L) 10/15/2020     Chemistry      Component Value Date/Time   NA 140 09/24/2020 1221   NA 137 02/06/2019 0000   K 4.0 09/24/2020 1221   CL 103 09/24/2020 1221   CO2 25 09/24/2020 1221   BUN 35 (H) 09/24/2020 1221   BUN 36 (A) 02/06/2019 0000   CREATININE 1.56 (H) 09/24/2020 1221   GLU 105 02/06/2019 0000      Component Value Date/Time   CALCIUM 9.8 09/24/2020 1221   ALKPHOS 43 09/24/2020 1221   AST 21 09/24/2020 1221   ALT 16 09/24/2020 1221   BILITOT 0.8 09/24/2020 1221     I reviewed labs from outside.  His white blood cell count was 3700 hemoglobin of 13.3 and platelet count 106,000 He was noted to have some monocytosis.  Creatinine noted to be 1.6.  Otherwise calcium, total protein and alk phos are normal.  TSH is normal hemoglobin A1c is  5.5, vitamin D of 31.4.  PSA of 0.25.  CBC in September 2020 also showed a  platelet count of 111,000 Back in 2013, his white blood cell count was normal, his platelet count was 212,000 and his hemoglobin was normal.  Folate leves normal He has Hep C antibodies.  Undetectable hepatitis C viral RNA. CKD which is stable essentially B12 is normal Ferritin 284 Most recent CBC with normal WBC, hemoglobin and mildly low platelets Again, we dont have labs between 2013 and 2020 to compare and assess the chronicity of thrombocytopenia.  Labs from today are pending.  RADIOGRAPHIC STUDIES: I have personally reviewed the radiological images as listed and agreed with the findings in the report. No results found.  All questions were answered. The patient knows to call the clinic with any problems, questions or concerns. I spent  20 minutes in the care of this patient including H and P, review of records, counseling and coordination of care.     Benay Pike, MD 01/14/2021 1:06 PM

## 2021-02-04 ENCOUNTER — Other Ambulatory Visit (HOSPITAL_BASED_OUTPATIENT_CLINIC_OR_DEPARTMENT_OTHER): Payer: Self-pay

## 2021-02-25 ENCOUNTER — Other Ambulatory Visit: Payer: Self-pay

## 2021-02-25 ENCOUNTER — Telehealth: Payer: Self-pay | Admitting: *Deleted

## 2021-02-25 ENCOUNTER — Ambulatory Visit (INDEPENDENT_AMBULATORY_CARE_PROVIDER_SITE_OTHER): Payer: Medicare HMO

## 2021-02-25 ENCOUNTER — Ambulatory Visit: Payer: Medicare HMO | Admitting: Podiatry

## 2021-02-25 DIAGNOSIS — M1000 Idiopathic gout, unspecified site: Secondary | ICD-10-CM

## 2021-02-25 DIAGNOSIS — I739 Peripheral vascular disease, unspecified: Secondary | ICD-10-CM | POA: Diagnosis not present

## 2021-02-25 DIAGNOSIS — M778 Other enthesopathies, not elsewhere classified: Secondary | ICD-10-CM | POA: Diagnosis not present

## 2021-02-25 DIAGNOSIS — M79673 Pain in unspecified foot: Secondary | ICD-10-CM

## 2021-02-25 DIAGNOSIS — E1149 Type 2 diabetes mellitus with other diabetic neurological complication: Secondary | ICD-10-CM

## 2021-02-25 MED ORDER — COLCHICINE 0.6 MG PO TABS: 0.6000 mg | ORAL_TABLET | Freq: Every day | ORAL | 0 refills | Status: DC

## 2021-02-25 MED ORDER — COLCHICINE 0.6 MG PO CAPS: 1.0000 | ORAL_CAPSULE | Freq: Every day | ORAL | 0 refills | Status: DC

## 2021-02-25 MED ORDER — DOXYCYCLINE HYCLATE 100 MG PO TABS: 100.0000 mg | ORAL_TABLET | Freq: Two times a day (BID) | ORAL | 0 refills | Status: DC

## 2021-02-25 NOTE — Telephone Encounter (Signed)
Patient is calling because he only received one prescription at pharmacy(Doxycycline). Please advise.  Called pharmacy and they said that his insurance wants patient to try generic(Mitigare-0.6 mg or colchicine capsules instead).

## 2021-02-26 LAB — BASIC METABOLIC PANEL
BUN/Creatinine Ratio: 22 (ref 10–24)
BUN: 42 mg/dL — ABNORMAL HIGH (ref 8–27)
CO2: 26 mmol/L (ref 20–29)
Calcium: 10.2 mg/dL (ref 8.6–10.2)
Chloride: 98 mmol/L (ref 96–106)
Creatinine, Ser: 1.95 mg/dL — ABNORMAL HIGH (ref 0.76–1.27)
Glucose: 129 mg/dL — ABNORMAL HIGH (ref 65–99)
Potassium: 4.8 mmol/L (ref 3.5–5.2)
Sodium: 141 mmol/L (ref 134–144)
eGFR: 36 mL/min/{1.73_m2} — ABNORMAL LOW (ref >59)

## 2021-02-26 LAB — CBC WITH DIFFERENTIAL/PLATELET
Basophils Absolute: 0 10*3/uL (ref 0.0–0.2)
Basos: 1 %
EOS (ABSOLUTE): 0.2 10*3/uL (ref 0.0–0.4)
Eos: 3 %
Hematocrit: 36.7 % — ABNORMAL LOW (ref 37.5–51.0)
Hemoglobin: 12.5 g/dL — ABNORMAL LOW (ref 13.0–17.7)
Immature Grans (Abs): 0 10*3/uL (ref 0.0–0.1)
Immature Granulocytes: 0 %
Lymphocytes Absolute: 1.3 10*3/uL (ref 0.7–3.1)
Lymphs: 26 %
MCH: 31.9 pg (ref 26.6–33.0)
MCHC: 34.1 g/dL (ref 31.5–35.7)
MCV: 94 fL (ref 79–97)
Monocytes Absolute: 0.8 10*3/uL (ref 0.1–0.9)
Monocytes: 15 %
Neutrophils Absolute: 2.9 10*3/uL (ref 1.4–7.0)
Neutrophils: 55 %
Platelets: 147 10*3/uL — ABNORMAL LOW (ref 150–450)
RBC: 3.92 x10E6/uL — ABNORMAL LOW (ref 4.14–5.80)
RDW: 13.1 % (ref 11.6–15.4)
WBC: 5.2 10*3/uL (ref 3.4–10.8)

## 2021-02-26 LAB — URIC ACID: Uric Acid: 10.7 mg/dL — ABNORMAL HIGH (ref 3.8–8.4)

## 2021-02-26 NOTE — Telephone Encounter (Signed)
Returned call to patient and informed that new prescription is ready for pick up from pharmacy.

## 2021-03-03 ENCOUNTER — Other Ambulatory Visit: Payer: Self-pay | Admitting: Podiatry

## 2021-03-03 DIAGNOSIS — M778 Other enthesopathies, not elsewhere classified: Secondary | ICD-10-CM

## 2021-03-03 NOTE — Progress Notes (Signed)
Subjective:   Patient ID: Lance Hausen Sr., male   DOB: 70 y.o.   MRN: [id number]   HPI 70 year old male presents the office today for concerns of left foot swelling, pain.  He states this started on Sunday and he denies any recent injury.  He thinks it started after he was trying to massage his foot too much.  He also states he has an itching sensation to his feet he saw dermatologist previously he was given some cream which was not helpful.  He also states he has neuropathy gets burning bilaterally.  He is diabetic and last A1c was 6.5.  No recent injury to his feet.   Review of Systems  All other systems reviewed and are negative.  Past Medical History:  Diagnosis Date   Arthritis    Back pain    Diabetes mellitus without complication (HCC)    ORAL MEDS-NO INSULIN   GERD (gastroesophageal reflux disease)    OCCAS AFTER EATING CERTAIN FOODS   Hyperlipidemia    Hypertension    Joint pain    Rheumatoid arthritis ([place])    Shortness of breath    Sleep apnea    Ureteral stone    LEFT  --DISCOMFORT    Past Surgical History:  Procedure Laterality Date   ANAL FISSURE REPAIR     COLONOSCOPY WITH PROPOFOL N/A 03/02/2018   Procedure: COLONOSCOPY WITH PROPOFOL;  Surgeon: Carol Ada, MD;  Location: [facility];  Service: Endoscopy;  Laterality: N/A;   CYSTOSCOPY WITH URETEROSCOPY  05/16/2012   Procedure: CYSTOSCOPY WITH URETEROSCOPY;  Surgeon: Molli Hazard, MD;  Location: [facility];  Service: Urology;  Laterality: Left;  left ureteral stent placement   HOLMIUM LASER APPLICATION  12/45/8099   Procedure: HOLMIUM LASER APPLICATION;  Surgeon: Molli Hazard, MD;  Location: [facility];  Service: Urology;  Laterality: Left;   KNEE ARTHROSCOPY     SEVERAL  SURGERIES BOTH KNEES   POLYPECTOMY  03/02/2018   Procedure: POLYPECTOMY;  Surgeon: Carol Ada, MD;  Location: [facility];  Service: Endoscopy;;   SUBMUCOSAL INJECTION  03/02/2018   Procedure: SUBMUCOSAL INJECTION saline;   Surgeon: Carol Ada, MD;  Location: [facility];  Service: Endoscopy;;   VASCECTOMY       Current Outpatient Medications:    allopurinol (ZYLOPRIM) 300 MG tablet, , Disp: , Rfl:    Colchicine (MITIGARE) 0.6 MG CAPS, Take 1 capsule by mouth daily., Disp: 7 capsule, Rfl: 0   colchicine 0.6 MG tablet, Take 1 tablet (0.6 mg total) by mouth daily., Disp: 7 tablet, Rfl: 0   doxycycline (VIBRA-TABS) 100 MG tablet, Take 1 tablet (100 mg total) by mouth 2 (two) times daily., Disp: 20 tablet, Rfl: 0   ergocalciferol (VITAMIN D2) 1.25 MG (50000 UT) capsule, ergocalciferol (vitamin D2) 1,250 mcg (50,000 unit) capsule, Disp: , Rfl:    amLODipine (NORVASC) 10 MG tablet, Take 10 mg by mouth every morning., Disp: , Rfl:    amLODipine (NORVASC) 5 MG tablet, , Disp: , Rfl:    aspirin EC 81 MG tablet, Take 81 mg by mouth at bedtime., Disp: , Rfl:    augmented betamethasone dipropionate (DIPROLENE-AF) 0.05 % cream, , Disp: , Rfl:    carvedilol (COREG) 3.125 MG tablet, Take 3.125 mg by mouth 2 (two) times daily with a meal. , Disp: , Rfl:    clotrimazole-betamethasone (LOTRISONE) cream, , Disp: , Rfl:    Continuous Blood Gluc Receiver (FREESTYLE LIBRE 2 READER SYSTM) DEVI, 1 each by Does not apply route  daily. As directed, Disp: 1 Device, Rfl: 0   Continuous Blood Gluc Sensor (FREESTYLE LIBRE 14 DAY SENSOR) MISC, 1 each by Does not apply route daily. Apply to skin for 14 days then remove, Disp: 2 each, Rfl: 0   COVID-19 mRNA Vac-TriS, Pfizer, (PFIZER-BIONT COVID-19 VAC-TRIS) SUSP injection, Inject into the muscle., Disp: 0.3 mL, Rfl: 0   Cyanocobalamin (B-12 COMPLIANCE INJECTION IJ), B-12 Compliance, Disp: , Rfl:    doxazosin (CARDURA) 4 MG tablet, Take 4 mg by mouth at bedtime., Disp: , Rfl:    DROPLET PEN NEEDLES 32G X 4 MM MISC, , Disp: , Rfl:    empagliflozin (JARDIANCE) 25 MG TABS tablet, Take 25 mg by mouth daily., Disp: , Rfl:    furosemide (LASIX) 40 MG tablet, Take 40 mg by mouth daily as needed for  fluid., Disp: , Rfl:    glipiZIDE (GLUCOTROL) 5 MG tablet, Take 5 mg by mouth once., Disp: , Rfl:    latanoprost (XALATAN) 0.005 % ophthalmic solution, , Disp: , Rfl:    losartan-hydrochlorothiazide (HYZAAR) 100-25 MG tablet, Take 1 tablet by mouth daily. , Disp: , Rfl:    magnesium gluconate (MAGONATE) 500 MG tablet, Take 500 mg by mouth 3 (three) times a week., Disp: , Rfl:    magnesium gluconate (MAGONATE) 500 MG tablet, Take by mouth., Disp: , Rfl:    Methylcellulose, Laxative, 500 MG TABS, Take 500 mg by mouth daily., Disp: , Rfl:    Multiple Vitamin (MULTIVITAMIN WITH MINERALS) TABS, Take 1 tablet by mouth daily., Disp: , Rfl:    pravastatin (PRAVACHOL) 40 MG tablet, Take 40 mg by mouth at bedtime., Disp: , Rfl:    SHINGRIX injection, , Disp: , Rfl:    traMADol (ULTRAM) 50 MG tablet, Ultram 50 mg tablet  Take 1 tablet 3 times a day by oral route as needed for pain for 5 days., Disp: , Rfl:    VICTOZA 18 MG/3ML SOPN, Inject 0.6 mg into the skin daily. , Disp: , Rfl: 0   vitamin B-12 (CYANOCOBALAMIN) 500 MCG tablet, Take 500 mcg by mouth daily., Disp: , Rfl:   Allergies  Allergen Reactions   Hydrocodone-Acetaminophen Swelling    Remembers he didn't like   Vicodin [Hydrocodone-Acetaminophen] Other (See Comments)    Remembers he didn't like   Codeine Other (See Comments)    REACTION WAS YEARS AGO-PT DOES NOT REMEMBER WHAT THE REACTION WAS         Objective:  Physical Exam  General: AAO x3, NAD  Dermatological: There is edema of the left foot compared to contralateral extremity and there is mild erythema on the medial first MPJ but there does appear to be some erythema into the leg as well.  Slight warmth to the left foot compared to the contralateral extremity.  There is no open lesions there is no drainage or pus.  Vascular: Dorsalis Pedis artery and Posterior Tibial artery pedal pulses but decreased bilateral with immedate capillary fill time.  Appears to have chronic venous  insufficiency changes bilaterally but left side somewhat more swollen than right.  Calf is supple without any pain.  Neruologic: Sensation decreased with Semmes Weinstein monofilament  Musculoskeletal: There is tenderness on the first MPJ along the area of localized edema and erythema.  No specific area of pinpoint tenderness identified otherwise.  Ankle, subtalar joint range of motion intact and pain-free.  Muscular strength 5/5 in all groups tested bilateral.    Gait: Unassisted, Nonantalgic.       Assessment:  70 year old male with capsulitis, likely gout; venous insufficiency; neuropathy     Plan:  -Treatment options discussed including all alternatives, risks, and complications -Etiology of symptoms were discussed -X-rays were obtained and reviewed with the patient.  There is no evidence of acute fracture or stress fracture identified today. -Given the edema and erythema I do think he has gout but needs to still rule out infection.  Ordered blood work today including uric acid, CBC, BMP.  We will start doxycycline as well as a small short course of colchicine. -Discussed treatment options for neuropathy but will hold off on further medications for now until the acute symptoms resolved. -ABI ordered  Trula Slade DPM

## 2021-03-09 DIAGNOSIS — H401131 Primary open-angle glaucoma, bilateral, mild stage: Secondary | ICD-10-CM | POA: Diagnosis not present

## 2021-03-11 ENCOUNTER — Ambulatory Visit (HOSPITAL_COMMUNITY)
Admission: RE | Admit: 2021-03-11 | Discharge: 2021-03-11 | Disposition: A | Discharge status: Home / Self Care | Payer: Medicare HMO | Source: Ambulatory Visit | Attending: Podiatry | Admitting: Podiatry

## 2021-03-11 ENCOUNTER — Other Ambulatory Visit: Payer: Self-pay

## 2021-03-11 DIAGNOSIS — I739 Peripheral vascular disease, unspecified: Secondary | ICD-10-CM | POA: Diagnosis not present

## 2021-03-15 ENCOUNTER — Encounter: Payer: Self-pay | Admitting: Podiatry

## 2021-03-15 ENCOUNTER — Ambulatory Visit: Payer: Medicare HMO | Admitting: Podiatry

## 2021-03-15 ENCOUNTER — Other Ambulatory Visit: Payer: Self-pay

## 2021-03-15 DIAGNOSIS — M1000 Idiopathic gout, unspecified site: Secondary | ICD-10-CM

## 2021-03-15 DIAGNOSIS — M778 Other enthesopathies, not elsewhere classified: Secondary | ICD-10-CM | POA: Diagnosis not present

## 2021-03-15 NOTE — Patient Instructions (Signed)
Gout Gout is painful swelling of your joints. Gout is a type of arthritis. It is caused by having too much uric acid in your body. Uric acid is a chemical that is made when your body breaks down substances called purines. If your body has too much uric acid, sharp crystals can form and build up in your joints. This causes pain and swelling. Gout attacks can happen quickly and be very painful (acute gout). Over time, the attacks can affect more joints and happen more often (chronic gout). What are the causes? Too much uric acid in your blood. This can happen because: Your kidneys do not remove enough uric acid from your blood. Your body makes too much uric acid. You eat too many foods that are high in purines. These foods include organ meats, some seafood, and beer. Trauma or stress. What increases the risk? Having a family history of gout. Being male and middle-aged. Being male and having gone through menopause. Being very overweight (obese). Drinking alcohol, especially beer. Not having enough water in the body (being dehydrated). Losing weight too quickly. Having an organ transplant. Having lead poisoning. Taking certain medicines. Having kidney disease. Having a skin condition called psoriasis. What are the signs or symptoms? An attack of acute gout usually happens in just one joint. The most common place is the big toe. Attacks often start at night. Other joints that may be affected include joints of the feet, ankle, knee, fingers, wrist, or elbow. Symptoms of an attack may include: Very bad pain. Warmth. Swelling. Stiffness. Shiny, red, or purple skin. Tenderness. The affected joint may be very painful to touch. Chills and fever. Chronic gout may cause symptoms more often. More joints may be involved. You may also have white or yellow lumps (tophi) on your hands or feet or in other areas near your joints. How is this treated? Treatment for this condition has two phases:  treating an acute attack and preventing future attacks. Acute gout treatment may include: NSAIDs. Steroids. These are taken by mouth or injected into a joint. Colchicine. This medicine relieves pain and swelling. It can be given by mouth or through an IV tube. Preventive treatment may include: Taking small doses of NSAIDs or colchicine daily. Using a medicine that reduces uric acid levels in your blood. Making changes to your diet. You may need to see a food expert (dietitian) about what to eat and drink to prevent gout. Follow these instructions at home: During a gout attack  If told, put ice on the painful area: Put ice in a plastic bag. Place a towel between your skin and the bag. Leave the ice on for 20 minutes, 2-3 times a day. Raise (elevate) the painful joint above the level of your heart as often as you can. Rest the joint as much as possible. If the joint is in your leg, you may be given crutches. Follow instructions from your doctor about what you cannot eat or drink. Avoiding future gout attacks Eat a low-purine diet. Avoid foods and drinks such as: Liver. Kidney. Anchovies. Asparagus. Herring. Mushrooms. Mussels. Beer. Stay at a healthy weight. If you want to lose weight, talk with your doctor. Do not lose weight too fast. Start or continue an exercise plan as told by your doctor. Eating and drinking Drink enough fluids to keep your pee (urine) pale yellow. If you drink alcohol: Limit how much you use to: 0-1 drink a day for women. 0-2 drinks a day for men. Be aware of   how much alcohol is in your drink. In the [place] one drink equals one 12 oz bottle of beer (355 mL), one 5 oz glass of wine (148 mL), or one 1 oz glass of hard liquor (44 mL). General instructions Take over-the-counter and prescription medicines only as told by your doctor. Do not drive or use heavy machinery while taking prescription pain medicine. Return to your normal activities as told by your  doctor. Ask your doctor what activities are safe for you. Keep all follow-up visits as told by your doctor. This is important. Contact a doctor if: You have another gout attack. You still have symptoms of a gout attack after 10 days of treatment. You have problems (side effects) because of your medicines. You have chills or a fever. You have burning pain when you pee (urinate). You have pain in your lower back or belly. Get help right away if: You have very bad pain. Your pain cannot be controlled. You cannot pee. Summary Gout is painful swelling of the joints. The most common site of pain is the big toe, but it can affect other joints. Medicines and avoiding some foods can help to prevent and treat gout attacks. This information is not intended to replace advice given to you by your health care provider. Make sure you discuss any questions you have with your health care provider. Document Revised: 01/10/2018 Document Reviewed: 01/10/2018 Elsevier Patient Education  2022 [employer]  

## 2021-03-17 NOTE — Progress Notes (Signed)
Subjective: 70 year old male presents the office today for follow-up evaluation of left foot, leg pain.  He states he is doing much better.  He is on hospice with antibiotic and he did complete the course of colchicine and has been doing well.  No recent injury or changes otherwise.  No fevers or chills.  No other concerns.  Objective: AAO x3, NAD DP/PT pulses palpable bilaterally, CRT less than 3 seconds On today's exam there is no specific area of tenderness and particularly there is no pain on the first MPJ and there is no edema, erythema.  There is no significant erythema to the left leg or warmth.  There is no open lesions.  There is no drainage.  There is no pain with calf compression. No open lesions or pre-ulcerative lesions.  No pain with calf compression, swelling, warmth, erythema  Assessment: Resolving gout left lower extremity  Plan: -All treatment options discussed with the patient including all alternatives, risks, complications.  -Discussed with him diet modifications and hydration help with gout flares.  I do think his underlying kidney disease is also contributing to the symptoms.  Should have ongoing symptoms of recurrence of gout when he discussed with his kidney doctor and possibly long-term medication.  This is only second gout flare he reports. -Patient encouraged to call the office with any questions, concerns, change in symptoms.   Trula Slade DPM    ABI Findings: +---------+------------------+-----+---------+--------+ Right    Rt Pressure (mmHg)IndexWaveform Comment  +---------+------------------+-----+---------+--------+ Brachial 119                                      +---------+------------------+-----+---------+--------+ PTA      151               1.21 triphasic         +---------+------------------+-----+---------+--------+ DP       166               1.33 triphasic          +---------+------------------+-----+---------+--------+ Great Toe86                0.69                   +---------+------------------+-----+---------+--------+   +---------+------------------+-----+---------+-------+ Left     Lt Pressure (mmHg)IndexWaveform Comment +---------+------------------+-----+---------+-------+ Brachial 125                                     +---------+------------------+-----+---------+-------+ PTA      143               1.14 triphasic        +---------+------------------+-----+---------+-------+ DP       146               1.17 triphasic        +---------+------------------+-----+---------+-------+ Great Toe94                0.75                  +---------+------------------+-----+---------+-------+   +-------+-----------+-----------+------------+------------+ ABI/TBIToday's ABIToday's TBIPrevious ABIPrevious TBI +-------+-----------+-----------+------------+------------+ Right  [place]         0.69                                +-------+-----------+-----------+------------+------------+  Left   1.17       0.75                                +-------+-----------+-----------+------------+------------+           No previous ABI.   Summary: Right: Resting right ankle-brachial index indicates noncompressible right lower extremity arteries. The right toe-brachial index is normal.   Left: Resting left ankle-brachial index is within normal range. No evidence of significant left lower extremity arterial disease. The left toe-brachial index is normal.

## 2021-03-23 DIAGNOSIS — E1140 Type 2 diabetes mellitus with diabetic neuropathy, unspecified: Secondary | ICD-10-CM | POA: Diagnosis not present

## 2021-03-23 DIAGNOSIS — E7800 Pure hypercholesterolemia, unspecified: Secondary | ICD-10-CM | POA: Diagnosis not present

## 2021-03-23 DIAGNOSIS — E1122 Type 2 diabetes mellitus with diabetic chronic kidney disease: Secondary | ICD-10-CM | POA: Diagnosis not present

## 2021-03-23 DIAGNOSIS — D61818 Other pancytopenia: Secondary | ICD-10-CM | POA: Diagnosis not present

## 2021-03-23 DIAGNOSIS — I10 Essential (primary) hypertension: Secondary | ICD-10-CM | POA: Diagnosis not present

## 2021-03-23 DIAGNOSIS — E559 Vitamin D deficiency, unspecified: Secondary | ICD-10-CM | POA: Diagnosis not present

## 2021-03-23 DIAGNOSIS — M109 Gout, unspecified: Secondary | ICD-10-CM | POA: Diagnosis not present

## 2021-03-23 DIAGNOSIS — N183 Chronic kidney disease, stage 3 unspecified: Secondary | ICD-10-CM | POA: Diagnosis not present

## 2021-05-03 DIAGNOSIS — N1832 Chronic kidney disease, stage 3b: Secondary | ICD-10-CM | POA: Diagnosis not present

## 2021-05-13 DIAGNOSIS — D631 Anemia in chronic kidney disease: Secondary | ICD-10-CM | POA: Diagnosis not present

## 2021-05-13 DIAGNOSIS — Z87442 Personal history of urinary calculi: Secondary | ICD-10-CM | POA: Diagnosis not present

## 2021-05-13 DIAGNOSIS — R600 Localized edema: Secondary | ICD-10-CM | POA: Diagnosis not present

## 2021-05-13 DIAGNOSIS — I129 Hypertensive chronic kidney disease with stage 1 through stage 4 chronic kidney disease, or unspecified chronic kidney disease: Secondary | ICD-10-CM | POA: Diagnosis not present

## 2021-05-13 DIAGNOSIS — N1832 Chronic kidney disease, stage 3b: Secondary | ICD-10-CM | POA: Diagnosis not present

## 2021-05-25 DIAGNOSIS — R350 Frequency of micturition: Secondary | ICD-10-CM | POA: Diagnosis not present

## 2021-05-25 DIAGNOSIS — M109 Gout, unspecified: Secondary | ICD-10-CM | POA: Diagnosis not present

## 2021-06-08 DIAGNOSIS — H401131 Primary open-angle glaucoma, bilateral, mild stage: Secondary | ICD-10-CM | POA: Diagnosis not present

## 2021-06-16 NOTE — Progress Notes (Signed)
[name] NOTE  Patient Care Team: Lance Laroche, MD as PCP - General (Physical Medicine and Rehabilitation)  CHIEF COMPLAINTS/PURPOSE OF CONSULTATION:   Pancytopenia.  ASSESSMENT & PLAN:   No problem-specific Assessment & Plan notes found for this encounter.  Orders Placed This Encounter  Procedures   CBC with Differential/Platelet    Standing Status:   Standing    Number of Occurrences:   22    Standing Expiration Date:   06/17/2022   Comprehensive metabolic panel    Standing Status:   Standing    Number of Occurrences:   33    Standing Expiration Date:   06/17/2022    This is a very pleasant 70 year old male patient with past medical history significant for hypertension, dyslipidemia, arthritis, type 2 diabetes mellitus referred to hematology for evaluation of pancytopenia.   We do not have any labs between 2013 and 2020 to assess the chronicity of thrombocytopenia for this patient.  We have discussed about monitoring versus proceeding with bone marrow aspiration and biopsy. He was more interested in surveillance.  Since his last visit, he denies any new health complaints.  Ongoing intermittent episodes of gout, arthritis and neuropathy.  Physical examination no significant change. We will repeat CBC today and if pancytopenia appears to be stable, will continue follow-up.  Applauded that he has quit alcohol. If CBC shows worsening cytopenias, we can consider bone marrow aspiration and biopsy. Return to clinic in 6 months.  HISTORY OF PRESENTING ILLNESS:   Lance Hausen Sr. 70 y.o. male is here because of pancytopenia.  This is a very pleasant 70 year old male patient past medical history significant for arthritis, type 2 diabetes, dyslipidemia and hypertension questionable rheumatoid arthritis referred to hematology for evaluation of pancytopenia.    Interval History He is here for FU by himself. Since last visit, he denies any new medication,  hospitalizations, infection. Ongoing joint pain, arthritis and neuropathy in his feet. His weight has been stable since last visit. No more alcohol. No change in bowel habits or urinary habits. Rest of the pertinent 10 point ROS reviewed and unremarkable.  MEDICAL HISTORY:  Past Medical History:  Diagnosis Date   Arthritis    Back pain    Diabetes mellitus without complication (HCC)    ORAL MEDS-NO INSULIN   GERD (gastroesophageal reflux disease)    OCCAS AFTER EATING CERTAIN FOODS   Hyperlipidemia    Hypertension    Joint pain    Rheumatoid arthritis ([place])    Shortness of breath    Sleep apnea    Ureteral stone    LEFT  --DISCOMFORT    SURGICAL HISTORY: Past Surgical History:  Procedure Laterality Date   ANAL FISSURE REPAIR     COLONOSCOPY WITH PROPOFOL N/A 03/02/2018   Procedure: COLONOSCOPY WITH PROPOFOL;  Surgeon: Carol Ada, MD;  Location: [facility];  Service: Endoscopy;  Laterality: N/A;   CYSTOSCOPY WITH URETEROSCOPY  05/16/2012   Procedure: CYSTOSCOPY WITH URETEROSCOPY;  Surgeon: Molli Hazard, MD;  Location: [facility];  Service: Urology;  Laterality: Left;  left ureteral stent placement   HOLMIUM LASER APPLICATION  06/26/8249   Procedure: HOLMIUM LASER APPLICATION;  Surgeon: Molli Hazard, MD;  Location: [facility];  Service: Urology;  Laterality: Left;   KNEE ARTHROSCOPY     SEVERAL  SURGERIES BOTH KNEES   POLYPECTOMY  03/02/2018   Procedure: POLYPECTOMY;  Surgeon: Carol Ada, MD;  Location: [facility];  Service: Endoscopy;;   SUBMUCOSAL INJECTION  03/02/2018  Procedure: SUBMUCOSAL INJECTION saline;  Surgeon: Carol Ada, MD;  Location: [facility];  Service: Endoscopy;;   VASCECTOMY      SOCIAL HISTORY: Social History   Socioeconomic History   Marital status: Married    Spouse name: Not on file   Number of children: Not on file   Years of education: Not on file   Highest education level: Not on file  Occupational History   Not on  file  Tobacco Use   Smoking status: Former   Smokeless tobacco: Never   Tobacco comments:    QUIT SMOKING MANY YRS AGO  Vaping Use   Vaping Use: Never used  Substance and Sexual Activity   Alcohol use: Yes    Comment: OCCAS   Drug use: No   Sexual activity: Not on file  Other Topics Concern   Not on file  Social History Narrative   ** Merged History Encounter **       Social Determinants of Health   Financial Resource Strain: Not on file  Food Insecurity: Not on file  Transportation Needs: Not on file  Physical Activity: Not on file  Stress: Not on file  Social Connections: Not on file  Intimate Partner Violence: Not on file    FAMILY HISTORY: Family History  Problem Relation Age of Onset   Hypertension Mother    Obesity Father     ALLERGIES:  is allergic to hydrocodone-acetaminophen, vicodin [hydrocodone-acetaminophen], and codeine.  MEDICATIONS:  Current Outpatient Medications  Medication Sig Dispense Refill   allopurinol (ZYLOPRIM) 100 MG tablet Take 1 tablet by mouth daily.     allopurinol (ZYLOPRIM) 300 MG tablet      allopurinol (ZYLOPRIM) 300 MG tablet Take 1 tablet by mouth daily.     amLODipine (NORVASC) 10 MG tablet Take 10 mg by mouth every morning.     amLODipine (NORVASC) 10 MG tablet Take 1 tablet by mouth daily.     amLODipine (NORVASC) 5 MG tablet      aspirin 81 MG chewable tablet 1 tablet     aspirin EC 81 MG tablet Take 81 mg by mouth at bedtime.     augmented betamethasone dipropionate (DIPROLENE-AF) 0.05 % cream      carvedilol (COREG) 3.125 MG tablet Take 3.125 mg by mouth 2 (two) times daily with a meal.      clotrimazole-betamethasone (LOTRISONE) cream      Colchicine (MITIGARE) 0.6 MG CAPS Take 1 capsule by mouth daily. 7 capsule 0   colchicine 0.6 MG tablet Take 1 tablet (0.6 mg total) by mouth daily. 7 tablet 0   Continuous Blood Gluc Receiver (FREESTYLE LIBRE 2 READER SYSTM) DEVI 1 each by Does not apply route daily. As directed 1  Device 0   Continuous Blood Gluc Sensor (FREESTYLE LIBRE 14 DAY SENSOR) MISC 1 each by Does not apply route daily. Apply to skin for 14 days then remove 2 each 0   COVID-19 mRNA Vac-TriS, Pfizer, (PFIZER-BIONT COVID-19 VAC-TRIS) SUSP injection Inject into the muscle. 0.3 mL 0   Cyanocobalamin (B-12 COMPLIANCE INJECTION IJ) B-12 Compliance     Cyanocobalamin (VITAMIN B 12) 500 MCG TABS 1 tablet     doxazosin (CARDURA) 4 MG tablet Take 4 mg by mouth at bedtime.     doxazosin (CARDURA) 4 MG tablet Take 1 tablet by mouth at bedtime.     doxycycline (VIBRA-TABS) 100 MG tablet Take 1 tablet (100 mg total) by mouth 2 (two) times daily. 20 tablet 0   DROPLET  PEN NEEDLES 32G X 4 MM MISC      empagliflozin (JARDIANCE) 25 MG TABS tablet Take 25 mg by mouth daily.     ergocalciferol (VITAMIN D2) 1.25 MG (50000 UT) capsule ergocalciferol (vitamin D2) 1,250 mcg (50,000 unit) capsule     FLUZONE HIGH-DOSE QUADRIVALENT 0.7 ML SUSY      furosemide (LASIX) 40 MG tablet Take 40 mg by mouth daily as needed for fluid.     gabapentin (NEURONTIN) 100 MG capsule 1 capsule     glipiZIDE (GLUCOTROL) 5 MG tablet Take 5 mg by mouth once.     latanoprost (XALATAN) 0.005 % ophthalmic solution      losartan-hydrochlorothiazide (HYZAAR) 100-25 MG tablet Take 1 tablet by mouth daily.      magnesium gluconate (MAGONATE) 500 MG tablet Take 500 mg by mouth 3 (three) times a week.     magnesium gluconate (MAGONATE) 500 MG tablet Take by mouth.     Methylcellulose, Laxative, 500 MG TABS Take 500 mg by mouth daily.     Multiple Vitamin (MULTIVITAMIN WITH MINERALS) TABS Take 1 tablet by mouth daily.     PFIZER COVID-19 VAC BIVALENT injection      pravastatin (PRAVACHOL) 40 MG tablet Take 40 mg by mouth at bedtime.     SHINGRIX injection      traMADol (ULTRAM) 50 MG tablet Ultram 50 mg tablet  Take 1 tablet 3 times a day by oral route as needed for pain for 5 days.     VICTOZA 18 MG/3ML SOPN Inject 0.6 mg into the skin daily.   0    vitamin B-12 (CYANOCOBALAMIN) 500 MCG tablet Take 500 mcg by mouth daily.     No current facility-administered medications for this visit.     PHYSICAL EXAMINATION: ECOG PERFORMANCE STATUS: 0 - Asymptomatic  Vitals:   06/17/21 1315  BP: 135/73  Pulse: 67  Resp: 17  Temp: (!) 96.8 F (36 C)  SpO2: 97%   GENERAL:alert, no distress and comfortable, morbidly obese NECK: supple, no palpable lymphadenopathy LUNGS: clear to auscultation and percussion with normal breathing effort HEART: regular rate & rhythm and no murmurs and no lower extremity edema ABDOMEN: Limited exam in sitting position, no distention or tenderness.   Musculoskeletal:no cyanosis of digits and no clubbing.  Bilateral lower extremity 1-2+ edema which is chronic according to the patient. PSYCH: alert & oriented x 3 with fluent speech NEURO: no focal motor/sensory deficits  LABORATORY DATA:  I have reviewed the data as listed Lab Results  Component Value Date   WBC 5.2 02/25/2021   HGB 12.5 (L) 02/25/2021   HCT 36.7 (L) 02/25/2021   MCV 94 02/25/2021   PLT 147 (L) 02/25/2021     Chemistry      Component Value Date/Time   NA 141 02/25/2021 1158   K 4.8 02/25/2021 1158   CL 98 02/25/2021 1158   CO2 26 02/25/2021 1158   BUN 42 (H) 02/25/2021 1158   CREATININE 1.95 (H) 02/25/2021 1158   CREATININE 1.56 (H) 09/24/2020 1221   GLU 105 02/06/2019 0000      Component Value Date/Time   CALCIUM 10.2 02/25/2021 1158   ALKPHOS 50 01/14/2021 1317   AST 36 01/14/2021 1317   AST 21 09/24/2020 1221   ALT 26 01/14/2021 1317   ALT 16 09/24/2020 1221   BILITOT 0.6 01/14/2021 1317   BILITOT 0.8 09/24/2020 1221     I reviewed labs from outside.  His white blood cell count was  3700 hemoglobin of 13.3 and platelet count 106,000 He was noted to have some monocytosis.  Creatinine noted to be 1.6.  Otherwise calcium, total protein and alk phos are normal.  TSH is normal hemoglobin A1c is 5.5, vitamin D of 31.4.  PSA  of 0.25.  CBC in September 2020 also showed a platelet count of 111,000 Back in 2013, his white blood cell count was normal, his platelet count was 212,000 and his hemoglobin was normal.  Folate leves normal He has Hep C antibodies.  Undetectable hepatitis C viral RNA. CKD which is stable essentially B12 is normal Ferritin 284 Most recent CBC with normal WBC, hemoglobin and mildly low platelets Again, we dont have labs between 2013 and 2020 to compare and assess the chronicity of thrombocytopenia.  Labs from today are pending.  RADIOGRAPHIC STUDIES: I have personally reviewed the radiological images as listed and agreed with the findings in the report. No results found.  All questions were answered. The patient knows to call the clinic with any problems, questions or concerns. I spent  15 minutes in the care of this patient including H and P, review of records, counseling and coordination of care.     Benay Pike, MD 06/17/2021 1:57 PM

## 2021-06-17 ENCOUNTER — Inpatient Hospital Stay: Payer: Medicare HMO

## 2021-06-17 ENCOUNTER — Inpatient Hospital Stay: Payer: Medicare HMO | Attending: Hematology and Oncology | Admitting: Hematology and Oncology

## 2021-06-17 ENCOUNTER — Other Ambulatory Visit: Payer: Self-pay

## 2021-06-17 ENCOUNTER — Encounter: Payer: Self-pay | Admitting: Hematology and Oncology

## 2021-06-17 VITALS — BP 135/73 | HR 67 | Temp 96.8°F | Resp 17 | Wt 301.6 lb

## 2021-06-17 DIAGNOSIS — D696 Thrombocytopenia, unspecified: Secondary | ICD-10-CM | POA: Insufficient documentation

## 2021-06-17 DIAGNOSIS — G629 Polyneuropathy, unspecified: Secondary | ICD-10-CM | POA: Insufficient documentation

## 2021-06-17 DIAGNOSIS — Z79899 Other long term (current) drug therapy: Secondary | ICD-10-CM | POA: Diagnosis not present

## 2021-06-17 DIAGNOSIS — D61818 Other pancytopenia: Secondary | ICD-10-CM

## 2021-06-17 DIAGNOSIS — E119 Type 2 diabetes mellitus without complications: Secondary | ICD-10-CM | POA: Insufficient documentation

## 2021-06-17 DIAGNOSIS — Z7984 Long term (current) use of oral hypoglycemic drugs: Secondary | ICD-10-CM | POA: Insufficient documentation

## 2021-06-17 DIAGNOSIS — I10 Essential (primary) hypertension: Secondary | ICD-10-CM | POA: Diagnosis not present

## 2021-06-17 DIAGNOSIS — Z87891 Personal history of nicotine dependence: Secondary | ICD-10-CM | POA: Insufficient documentation

## 2021-06-17 LAB — COMPREHENSIVE METABOLIC PANEL
ALT: 13 U/L (ref 0–44)
AST: 16 U/L (ref 15–41)
Albumin: 3.6 g/dL (ref 3.5–5.0)
Alkaline Phosphatase: 66 U/L (ref 38–126)
Anion gap: 10 (ref 5–15)
BUN: 44 mg/dL — ABNORMAL HIGH (ref 8–23)
CO2: 26 mmol/L (ref 22–32)
Calcium: 10.2 mg/dL (ref 8.9–10.3)
Chloride: 103 mmol/L (ref 98–111)
Creatinine, Ser: 1.91 mg/dL — ABNORMAL HIGH (ref 0.61–1.24)
GFR, Estimated: 37 mL/min — ABNORMAL LOW (ref >60)
Glucose, Bld: 84 mg/dL (ref 70–99)
Potassium: 4.3 mmol/L (ref 3.5–5.1)
Sodium: 139 mmol/L (ref 135–145)
Total Bilirubin: 0.5 mg/dL (ref 0.3–1.2)
Total Protein: 8 g/dL (ref 6.5–8.1)

## 2021-06-17 LAB — CBC WITH DIFFERENTIAL/PLATELET
Abs Immature Granulocytes: 0 10*3/uL (ref 0.00–0.07)
Basophils Absolute: 0 10*3/uL (ref 0.0–0.1)
Basophils Relative: 1 %
Eosinophils Absolute: 0.1 10*3/uL (ref 0.0–0.5)
Eosinophils Relative: 2 %
HCT: 40 % (ref 39.0–52.0)
Hemoglobin: 12.5 g/dL — ABNORMAL LOW (ref 13.0–17.0)
Immature Granulocytes: 0 %
Lymphocytes Relative: 38 %
Lymphs Abs: 1.6 10*3/uL (ref 0.7–4.0)
MCH: 28.9 pg (ref 26.0–34.0)
MCHC: 31.3 g/dL (ref 30.0–36.0)
MCV: 92.6 fL (ref 80.0–100.0)
Monocytes Absolute: 0.7 10*3/uL (ref 0.1–1.0)
Monocytes Relative: 17 %
Neutro Abs: 1.7 10*3/uL (ref 1.7–7.7)
Neutrophils Relative %: 42 %
Platelets: 150 10*3/uL (ref 150–400)
RBC: 4.32 MIL/uL (ref 4.22–5.81)
RDW: 14.6 % (ref 11.5–15.5)
WBC: 4.2 10*3/uL (ref 4.0–10.5)
nRBC: 0 % (ref 0.0–0.2)

## 2021-07-02 DIAGNOSIS — M1990 Unspecified osteoarthritis, unspecified site: Secondary | ICD-10-CM | POA: Diagnosis not present

## 2021-07-02 DIAGNOSIS — E1122 Type 2 diabetes mellitus with diabetic chronic kidney disease: Secondary | ICD-10-CM | POA: Diagnosis not present

## 2021-07-02 DIAGNOSIS — N183 Chronic kidney disease, stage 3 unspecified: Secondary | ICD-10-CM | POA: Diagnosis not present

## 2021-07-02 DIAGNOSIS — E1140 Type 2 diabetes mellitus with diabetic neuropathy, unspecified: Secondary | ICD-10-CM | POA: Diagnosis not present

## 2021-09-09 ENCOUNTER — Telehealth: Payer: Self-pay | Admitting: Hematology and Oncology

## 2021-09-09 NOTE — Telephone Encounter (Signed)
Rescheduled appointment per providers template. Left message.  ? ?

## 2021-09-13 DIAGNOSIS — M109 Gout, unspecified: Secondary | ICD-10-CM | POA: Diagnosis not present

## 2021-09-13 DIAGNOSIS — E1122 Type 2 diabetes mellitus with diabetic chronic kidney disease: Secondary | ICD-10-CM | POA: Diagnosis not present

## 2021-09-13 DIAGNOSIS — D61818 Other pancytopenia: Secondary | ICD-10-CM | POA: Diagnosis not present

## 2021-09-13 DIAGNOSIS — E7800 Pure hypercholesterolemia, unspecified: Secondary | ICD-10-CM | POA: Diagnosis not present

## 2021-09-13 DIAGNOSIS — Z125 Encounter for screening for malignant neoplasm of prostate: Secondary | ICD-10-CM | POA: Diagnosis not present

## 2021-09-13 DIAGNOSIS — R350 Frequency of micturition: Secondary | ICD-10-CM | POA: Diagnosis not present

## 2021-09-13 DIAGNOSIS — E559 Vitamin D deficiency, unspecified: Secondary | ICD-10-CM | POA: Diagnosis not present

## 2021-09-20 DIAGNOSIS — N183 Chronic kidney disease, stage 3 unspecified: Secondary | ICD-10-CM | POA: Diagnosis not present

## 2021-09-20 DIAGNOSIS — E559 Vitamin D deficiency, unspecified: Secondary | ICD-10-CM | POA: Diagnosis not present

## 2021-09-20 DIAGNOSIS — E7800 Pure hypercholesterolemia, unspecified: Secondary | ICD-10-CM | POA: Diagnosis not present

## 2021-09-20 DIAGNOSIS — Z0000 Encounter for general adult medical examination without abnormal findings: Secondary | ICD-10-CM | POA: Diagnosis not present

## 2021-09-20 DIAGNOSIS — M1990 Unspecified osteoarthritis, unspecified site: Secondary | ICD-10-CM | POA: Diagnosis not present

## 2021-09-20 DIAGNOSIS — E1122 Type 2 diabetes mellitus with diabetic chronic kidney disease: Secondary | ICD-10-CM | POA: Diagnosis not present

## 2021-09-20 DIAGNOSIS — Z23 Encounter for immunization: Secondary | ICD-10-CM | POA: Diagnosis not present

## 2021-09-20 DIAGNOSIS — I10 Essential (primary) hypertension: Secondary | ICD-10-CM | POA: Diagnosis not present

## 2021-09-20 DIAGNOSIS — M109 Gout, unspecified: Secondary | ICD-10-CM | POA: Diagnosis not present

## 2021-09-23 DIAGNOSIS — E119 Type 2 diabetes mellitus without complications: Secondary | ICD-10-CM | POA: Diagnosis not present

## 2021-10-26 ENCOUNTER — Telehealth: Payer: Self-pay | Admitting: Hematology and Oncology

## 2021-10-26 NOTE — Telephone Encounter (Signed)
Rescheduled appointment per providers template. Left message.  ? ?

## 2021-11-08 DIAGNOSIS — M65332 Trigger finger, left middle finger: Secondary | ICD-10-CM | POA: Diagnosis not present

## 2021-11-08 DIAGNOSIS — M2550 Pain in unspecified joint: Secondary | ICD-10-CM | POA: Diagnosis not present

## 2021-11-08 DIAGNOSIS — M79642 Pain in left hand: Secondary | ICD-10-CM | POA: Diagnosis not present

## 2021-11-08 DIAGNOSIS — M79641 Pain in right hand: Secondary | ICD-10-CM | POA: Diagnosis not present

## 2021-11-15 DIAGNOSIS — N1832 Chronic kidney disease, stage 3b: Secondary | ICD-10-CM | POA: Diagnosis not present

## 2021-11-15 DIAGNOSIS — N189 Chronic kidney disease, unspecified: Secondary | ICD-10-CM | POA: Diagnosis not present

## 2021-11-16 DIAGNOSIS — R700 Elevated erythrocyte sedimentation rate: Secondary | ICD-10-CM | POA: Diagnosis not present

## 2021-11-16 DIAGNOSIS — N1832 Chronic kidney disease, stage 3b: Secondary | ICD-10-CM | POA: Diagnosis not present

## 2021-11-16 DIAGNOSIS — R768 Other specified abnormal immunological findings in serum: Secondary | ICD-10-CM | POA: Diagnosis not present

## 2021-11-16 DIAGNOSIS — M109 Gout, unspecified: Secondary | ICD-10-CM | POA: Diagnosis not present

## 2021-11-16 DIAGNOSIS — M1990 Unspecified osteoarthritis, unspecified site: Secondary | ICD-10-CM | POA: Diagnosis not present

## 2021-11-16 DIAGNOSIS — M2550 Pain in unspecified joint: Secondary | ICD-10-CM | POA: Diagnosis not present

## 2021-11-16 DIAGNOSIS — D696 Thrombocytopenia, unspecified: Secondary | ICD-10-CM | POA: Diagnosis not present

## 2021-11-25 DIAGNOSIS — N1832 Chronic kidney disease, stage 3b: Secondary | ICD-10-CM | POA: Diagnosis not present

## 2021-11-25 DIAGNOSIS — Z87442 Personal history of urinary calculi: Secondary | ICD-10-CM | POA: Diagnosis not present

## 2021-11-25 DIAGNOSIS — R600 Localized edema: Secondary | ICD-10-CM | POA: Diagnosis not present

## 2021-11-25 DIAGNOSIS — D631 Anemia in chronic kidney disease: Secondary | ICD-10-CM | POA: Diagnosis not present

## 2021-11-25 DIAGNOSIS — I129 Hypertensive chronic kidney disease with stage 1 through stage 4 chronic kidney disease, or unspecified chronic kidney disease: Secondary | ICD-10-CM | POA: Diagnosis not present

## 2021-11-30 DIAGNOSIS — M1990 Unspecified osteoarthritis, unspecified site: Secondary | ICD-10-CM | POA: Diagnosis not present

## 2021-11-30 DIAGNOSIS — M2550 Pain in unspecified joint: Secondary | ICD-10-CM | POA: Diagnosis not present

## 2021-11-30 DIAGNOSIS — M0579 Rheumatoid arthritis with rheumatoid factor of multiple sites without organ or systems involvement: Secondary | ICD-10-CM | POA: Diagnosis not present

## 2021-11-30 DIAGNOSIS — R768 Other specified abnormal immunological findings in serum: Secondary | ICD-10-CM | POA: Diagnosis not present

## 2021-11-30 DIAGNOSIS — D696 Thrombocytopenia, unspecified: Secondary | ICD-10-CM | POA: Diagnosis not present

## 2021-11-30 DIAGNOSIS — L209 Atopic dermatitis, unspecified: Secondary | ICD-10-CM | POA: Diagnosis not present

## 2021-11-30 DIAGNOSIS — M109 Gout, unspecified: Secondary | ICD-10-CM | POA: Diagnosis not present

## 2021-11-30 DIAGNOSIS — N1832 Chronic kidney disease, stage 3b: Secondary | ICD-10-CM | POA: Diagnosis not present

## 2021-11-30 DIAGNOSIS — R700 Elevated erythrocyte sedimentation rate: Secondary | ICD-10-CM | POA: Diagnosis not present

## 2021-12-09 ENCOUNTER — Other Ambulatory Visit: Payer: Self-pay

## 2021-12-09 ENCOUNTER — Inpatient Hospital Stay: Payer: Medicare HMO | Attending: Hematology and Oncology | Admitting: Hematology and Oncology

## 2021-12-09 ENCOUNTER — Inpatient Hospital Stay: Payer: Medicare HMO

## 2021-12-09 ENCOUNTER — Encounter: Payer: Self-pay | Admitting: Hematology and Oncology

## 2021-12-09 VITALS — BP 127/65 | HR 65 | Temp 97.5°F | Resp 19 | Ht 68.5 in | Wt 319.6 lb

## 2021-12-09 DIAGNOSIS — Z87891 Personal history of nicotine dependence: Secondary | ICD-10-CM | POA: Diagnosis not present

## 2021-12-09 DIAGNOSIS — D61818 Other pancytopenia: Secondary | ICD-10-CM | POA: Diagnosis not present

## 2021-12-09 DIAGNOSIS — D696 Thrombocytopenia, unspecified: Secondary | ICD-10-CM | POA: Diagnosis not present

## 2021-12-09 DIAGNOSIS — Z79899 Other long term (current) drug therapy: Secondary | ICD-10-CM | POA: Insufficient documentation

## 2021-12-09 DIAGNOSIS — E1122 Type 2 diabetes mellitus with diabetic chronic kidney disease: Secondary | ICD-10-CM | POA: Diagnosis not present

## 2021-12-09 DIAGNOSIS — N189 Chronic kidney disease, unspecified: Secondary | ICD-10-CM | POA: Diagnosis not present

## 2021-12-09 LAB — CBC WITH DIFFERENTIAL/PLATELET
Abs Immature Granulocytes: 0.02 10*3/uL (ref 0.00–0.07)
Basophils Absolute: 0 10*3/uL (ref 0.0–0.1)
Basophils Relative: 1 %
Eosinophils Absolute: 0.1 10*3/uL (ref 0.0–0.5)
Eosinophils Relative: 1 %
HCT: 41.2 % (ref 39.0–52.0)
Hemoglobin: 13.5 g/dL (ref 13.0–17.0)
Immature Granulocytes: 0 %
Lymphocytes Relative: 30 %
Lymphs Abs: 1.5 10*3/uL (ref 0.7–4.0)
MCH: 30 pg (ref 26.0–34.0)
MCHC: 32.8 g/dL (ref 30.0–36.0)
MCV: 91.6 fL (ref 80.0–100.0)
Monocytes Absolute: 0.7 10*3/uL (ref 0.1–1.0)
Monocytes Relative: 15 %
Neutro Abs: 2.6 10*3/uL (ref 1.7–7.7)
Neutrophils Relative %: 53 %
Platelets: 131 10*3/uL — ABNORMAL LOW (ref 150–400)
RBC: 4.5 MIL/uL (ref 4.22–5.81)
RDW: 15.9 % — ABNORMAL HIGH (ref 11.5–15.5)
WBC: 4.9 10*3/uL (ref 4.0–10.5)
nRBC: 0 % (ref 0.0–0.2)

## 2021-12-09 LAB — COMPREHENSIVE METABOLIC PANEL
ALT: 12 U/L (ref 0–44)
AST: 13 U/L — ABNORMAL LOW (ref 15–41)
Albumin: 4.2 g/dL (ref 3.5–5.0)
Alkaline Phosphatase: 55 U/L (ref 38–126)
Anion gap: 8 (ref 5–15)
BUN: 45 mg/dL — ABNORMAL HIGH (ref 8–23)
CO2: 26 mmol/L (ref 22–32)
Calcium: 10.2 mg/dL (ref 8.9–10.3)
Chloride: 104 mmol/L (ref 98–111)
Creatinine, Ser: 1.99 mg/dL — ABNORMAL HIGH (ref 0.61–1.24)
GFR, Estimated: 35 mL/min — ABNORMAL LOW (ref >60)
Glucose, Bld: 112 mg/dL — ABNORMAL HIGH (ref 70–99)
Potassium: 4.2 mmol/L (ref 3.5–5.1)
Sodium: 138 mmol/L (ref 135–145)
Total Bilirubin: 0.7 mg/dL (ref 0.3–1.2)
Total Protein: 7.4 g/dL (ref 6.5–8.1)

## 2021-12-09 NOTE — Progress Notes (Signed)
[name] NOTE  Patient Care Team: Lance Laroche, MD as PCP - General (Physical Medicine and Rehabilitation)  CHIEF COMPLAINTS/PURPOSE OF CONSULTATION:   Pancytopenia.  ASSESSMENT & PLAN:   This is a very pleasant 71 year old male patient with past medical history significant for hypertension, dyslipidemia, arthritis, type 2 diabetes mellitus referred to hematology for evaluation of pancytopenia.   We do not have any labs between 2013 and 2020 to assess the chronicity of thrombocytopenia for this patient.  We have discussed about monitoring versus proceeding with bone marrow aspiration and biopsy. He was more interested in surveillance.   He feels well, no change in health.  CBC from today reviewed, no leukopenia or anemia.  Platelet count stable at 131,000. Given stable thrombocytopenia and resolution of leukopenia, I think it is reasonable to continue follow-up in 6 months.  He is agreeable to this.  He will return to clinic in 6 months.  HISTORY OF PRESENTING ILLNESS:   Lance Gregory. 71 y.o. male is here because of pancytopenia.  This is a very pleasant 71 year old male patient past medical history significant for arthritis, type 2 diabetes, dyslipidemia and hypertension questionable rheumatoid arthritis referred to hematology for evaluation of pancytopenia.    Interval History He is here for FU by himself. He had some eye problems, arthritis No hospitalizations, no infections. No bleeding  complaints No B symptoms.  Rest of the pertinent 10 point ROS reviewed and unremarkable.  MEDICAL HISTORY:  Past Medical History:  Diagnosis Date   Arthritis    Back pain    Diabetes mellitus without complication (HCC)    ORAL MEDS-NO INSULIN   GERD (gastroesophageal reflux disease)    OCCAS AFTER EATING CERTAIN FOODS   Hyperlipidemia    Hypertension    Joint pain    Rheumatoid arthritis ([place])    Shortness of breath    Sleep apnea    Ureteral  stone    LEFT  --DISCOMFORT    SURGICAL HISTORY: Past Surgical History:  Procedure Laterality Date   ANAL FISSURE REPAIR     COLONOSCOPY WITH PROPOFOL N/A 03/02/2018   Procedure: COLONOSCOPY WITH PROPOFOL;  Surgeon: Carol Ada, MD;  Location: [facility];  Service: Endoscopy;  Laterality: N/A;   CYSTOSCOPY WITH URETEROSCOPY  05/16/2012   Procedure: CYSTOSCOPY WITH URETEROSCOPY;  Surgeon: Molli Hazard, MD;  Location: [facility];  Service: Urology;  Laterality: Left;  left ureteral stent placement   HOLMIUM LASER APPLICATION  91/50/5697   Procedure: HOLMIUM LASER APPLICATION;  Surgeon: Molli Hazard, MD;  Location: [facility];  Service: Urology;  Laterality: Left;   KNEE ARTHROSCOPY     SEVERAL  SURGERIES BOTH KNEES   POLYPECTOMY  03/02/2018   Procedure: POLYPECTOMY;  Surgeon: Carol Ada, MD;  Location: [facility];  Service: Endoscopy;;   SUBMUCOSAL INJECTION  03/02/2018   Procedure: SUBMUCOSAL INJECTION saline;  Surgeon: Carol Ada, MD;  Location: [facility];  Service: Endoscopy;;   VASCECTOMY      SOCIAL HISTORY: Social History   Socioeconomic History   Marital status: Married    Spouse name: Not on file   Number of children: Not on file   Years of education: Not on file   Highest education level: Not on file  Occupational History   Not on file  Tobacco Use   Smoking status: Former   Smokeless tobacco: Never   Tobacco comments:    QUIT SMOKING MANY YRS AGO  Vaping Use   Vaping Use: Never  used  Substance and Sexual Activity   Alcohol use: Yes    Comment: OCCAS   Drug use: No   Sexual activity: Not on file  Other Topics Concern   Not on file  Social History Narrative   ** Merged History Encounter **       Social Determinants of Health   Financial Resource Strain: Not on file  Food Insecurity: Not on file  Transportation Needs: Not on file  Physical Activity: Not on file  Stress: Not on file  Social Connections: Not on file  Intimate  Partner Violence: Not on file    FAMILY HISTORY: Family History  Problem Relation Age of Onset   Hypertension Mother    Obesity Father     ALLERGIES:  is allergic to hydrocodone-acetaminophen, vicodin [hydrocodone-acetaminophen], and codeine.  MEDICATIONS:  Current Outpatient Medications  Medication Sig Dispense Refill   allopurinol (ZYLOPRIM) 300 MG tablet Take 1 tablet by mouth daily.     amLODipine (NORVASC) 10 MG tablet Take 10 mg by mouth every morning.     amLODipine (NORVASC) 10 MG tablet Take 1 tablet by mouth daily.     amLODipine (NORVASC) 5 MG tablet      aspirin 81 MG chewable tablet 1 tablet     aspirin EC 81 MG tablet Take 81 mg by mouth at bedtime.     augmented betamethasone dipropionate (DIPROLENE-AF) 0.05 % cream      carvedilol (COREG) 3.125 MG tablet Take 3.125 mg by mouth 2 (two) times daily with a meal.      clotrimazole-betamethasone (LOTRISONE) cream      Colchicine (MITIGARE) 0.6 MG CAPS Take 1 capsule by mouth daily. 7 capsule 0   colchicine 0.6 MG tablet Take 1 tablet (0.6 mg total) by mouth daily. 7 tablet 0   Continuous Blood Gluc Receiver (FREESTYLE LIBRE 2 READER SYSTM) DEVI 1 each by Does not apply route daily. As directed 1 Device 0   Continuous Blood Gluc Sensor (FREESTYLE LIBRE 14 DAY SENSOR) MISC 1 each by Does not apply route daily. Apply to skin for 14 days then remove 2 each 0   COVID-19 mRNA Vac-TriS, Pfizer, (PFIZER-BIONT COVID-19 VAC-TRIS) SUSP injection Inject into the muscle. 0.3 mL 0   Cyanocobalamin (B-12 COMPLIANCE INJECTION IJ) B-12 Compliance     Cyanocobalamin (VITAMIN B 12) 500 MCG TABS 1 tablet     doxazosin (CARDURA) 4 MG tablet Take 4 mg by mouth at bedtime.     doxazosin (CARDURA) 4 MG tablet Take 1 tablet by mouth at bedtime.     doxycycline (VIBRA-TABS) 100 MG tablet Take 1 tablet (100 mg total) by mouth 2 (two) times daily. 20 tablet 0   DROPLET PEN NEEDLES 32G X 4 MM MISC      empagliflozin (JARDIANCE) 25 MG TABS tablet  Take 25 mg by mouth daily.     ergocalciferol (VITAMIN D2) 1.25 MG (50000 UT) capsule ergocalciferol (vitamin D2) 1,250 mcg (50,000 unit) capsule     FLUZONE HIGH-DOSE QUADRIVALENT 0.7 ML SUSY      furosemide (LASIX) 40 MG tablet Take 40 mg by mouth daily as needed for fluid.     gabapentin (NEURONTIN) 100 MG capsule 1 capsule     glipiZIDE (GLUCOTROL) 5 MG tablet Take 5 mg by mouth once.     latanoprost (XALATAN) 0.005 % ophthalmic solution      losartan-hydrochlorothiazide (HYZAAR) 100-25 MG tablet Take 1 tablet by mouth daily.      magnesium gluconate (MAGONATE) 500  MG tablet Take 500 mg by mouth 3 (three) times a week.     magnesium gluconate (MAGONATE) 500 MG tablet Take by mouth.     Methylcellulose, Laxative, 500 MG TABS Take 500 mg by mouth daily.     Multiple Vitamin (MULTIVITAMIN WITH MINERALS) TABS Take 1 tablet by mouth daily.     PFIZER COVID-19 VAC BIVALENT injection      pravastatin (PRAVACHOL) 40 MG tablet Take 40 mg by mouth at bedtime.     SHINGRIX injection      traMADol (ULTRAM) 50 MG tablet Ultram 50 mg tablet  Take 1 tablet 3 times a day by oral route as needed for pain for 5 days.     VICTOZA 18 MG/3ML SOPN Inject 0.6 mg into the skin daily.   0   vitamin B-12 (CYANOCOBALAMIN) 500 MCG tablet Take 500 mcg by mouth daily.     No current facility-administered medications for this visit.     PHYSICAL EXAMINATION: ECOG PERFORMANCE STATUS: 0 - Asymptomatic  Vitals:   12/09/21 1222  BP: 127/65  Pulse: 65  Resp: 19  Temp: (!) 97.5 F (36.4 C)  SpO2: 96%    Physical Exam Constitutional:      Appearance: Normal appearance.  Cardiovascular:     Rate and Rhythm: Normal rate and regular rhythm.     Pulses: Normal pulses.     Heart sounds: Normal heart sounds.  Pulmonary:     Effort: Pulmonary effort is normal.     Breath sounds: Normal breath sounds.  Abdominal:     General: Abdomen is flat. Bowel sounds are normal.     Palpations: Abdomen is soft.   Musculoskeletal:     Cervical back: Normal range of motion and neck supple. No rigidity.  Lymphadenopathy:     Cervical: No cervical adenopathy.  Neurological:     Mental Status: He is alert.      LABORATORY DATA:  I have reviewed the data as listed Lab Results  Component Value Date   WBC 4.9 12/09/2021   HGB 13.5 12/09/2021   HCT 41.2 12/09/2021   MCV 91.6 12/09/2021   PLT 131 (L) 12/09/2021     Chemistry      Component Value Date/Time   NA 139 06/17/2021 1409   NA 141 02/25/2021 1158   K 4.3 06/17/2021 1409   CL 103 06/17/2021 1409   CO2 26 06/17/2021 1409   BUN 44 (H) 06/17/2021 1409   BUN 42 (H) 02/25/2021 1158   CREATININE 1.91 (H) 06/17/2021 1409   CREATININE 1.56 (H) 09/24/2020 1221   GLU 105 02/06/2019 0000      Component Value Date/Time   CALCIUM 10.2 06/17/2021 1409   ALKPHOS 66 06/17/2021 1409   AST 16 06/17/2021 1409   AST 21 09/24/2020 1221   ALT 13 06/17/2021 1409   ALT 16 09/24/2020 1221   BILITOT 0.5 06/17/2021 1409   BILITOT 0.8 09/24/2020 1221     I reviewed labs from outside.  His white blood cell count was 3700 hemoglobin of 13.3 and platelet count 106,000 He was noted to have some monocytosis.  Creatinine noted to be 1.6.  Otherwise calcium, total protein and alk phos are normal.  TSH is normal hemoglobin A1c is 5.5, vitamin D of 31.4.  PSA of 0.25.  CBC in September 2020 also showed a platelet count of 111,000 Back in 2013, his white blood cell count was normal, his platelet count was 212,000 and his hemoglobin was normal.  Folate leves normal He has Hep C antibodies.  Undetectable hepatitis C viral RNA. CKD which is stable essentially B12 is normal Ferritin 284 Most recent CBC with normal WBC, hemoglobin and mildly low platelets Again, we dont have labs between 2013 and 2020 to compare and assess the chronicity of thrombocytopenia.  Labs from 12/09/2021 with a white blood cell count of 4.9, hemoglobin of 13.5 and platelet count of  131,000. Overall stable thrombocytopenia.  RADIOGRAPHIC STUDIES: I have personally reviewed the radiological images as listed and agreed with the findings in the report. No results found.  All questions were answered. The patient knows to call the clinic with any problems, questions or concerns. I spent 20 minutes in the care of this patient including H and P, review of records, counseling and coordination of care.     Benay Pike, MD 12/09/2021 12:34 PM

## 2021-12-16 ENCOUNTER — Ambulatory Visit: Payer: Medicare HMO | Admitting: Hematology and Oncology

## 2021-12-23 ENCOUNTER — Ambulatory Visit: Payer: Medicare HMO | Admitting: Hematology and Oncology

## 2021-12-28 DIAGNOSIS — M0579 Rheumatoid arthritis with rheumatoid factor of multiple sites without organ or systems involvement: Secondary | ICD-10-CM | POA: Diagnosis not present

## 2021-12-30 DIAGNOSIS — H401131 Primary open-angle glaucoma, bilateral, mild stage: Secondary | ICD-10-CM | POA: Diagnosis not present

## 2022-02-21 DIAGNOSIS — R0989 Other specified symptoms and signs involving the circulatory and respiratory systems: Secondary | ICD-10-CM | POA: Diagnosis not present

## 2022-02-21 DIAGNOSIS — R051 Acute cough: Secondary | ICD-10-CM | POA: Diagnosis not present

## 2022-02-21 DIAGNOSIS — R0981 Nasal congestion: Secondary | ICD-10-CM | POA: Diagnosis not present

## 2022-03-23 DIAGNOSIS — E7800 Pure hypercholesterolemia, unspecified: Secondary | ICD-10-CM | POA: Diagnosis not present

## 2022-03-23 DIAGNOSIS — E1122 Type 2 diabetes mellitus with diabetic chronic kidney disease: Secondary | ICD-10-CM | POA: Diagnosis not present

## 2022-03-23 DIAGNOSIS — M109 Gout, unspecified: Secondary | ICD-10-CM | POA: Diagnosis not present

## 2022-03-23 DIAGNOSIS — E559 Vitamin D deficiency, unspecified: Secondary | ICD-10-CM | POA: Diagnosis not present

## 2022-03-30 DIAGNOSIS — D61818 Other pancytopenia: Secondary | ICD-10-CM | POA: Diagnosis not present

## 2022-03-30 DIAGNOSIS — N1831 Chronic kidney disease, stage 3a: Secondary | ICD-10-CM | POA: Diagnosis not present

## 2022-03-30 DIAGNOSIS — Z23 Encounter for immunization: Secondary | ICD-10-CM | POA: Diagnosis not present

## 2022-03-30 DIAGNOSIS — M1990 Unspecified osteoarthritis, unspecified site: Secondary | ICD-10-CM | POA: Diagnosis not present

## 2022-03-30 DIAGNOSIS — H401131 Primary open-angle glaucoma, bilateral, mild stage: Secondary | ICD-10-CM | POA: Diagnosis not present

## 2022-03-30 DIAGNOSIS — E7800 Pure hypercholesterolemia, unspecified: Secondary | ICD-10-CM | POA: Diagnosis not present

## 2022-03-30 DIAGNOSIS — E1122 Type 2 diabetes mellitus with diabetic chronic kidney disease: Secondary | ICD-10-CM | POA: Diagnosis not present

## 2022-03-30 DIAGNOSIS — I10 Essential (primary) hypertension: Secondary | ICD-10-CM | POA: Diagnosis not present

## 2022-03-30 DIAGNOSIS — E559 Vitamin D deficiency, unspecified: Secondary | ICD-10-CM | POA: Diagnosis not present

## 2022-03-30 DIAGNOSIS — M109 Gout, unspecified: Secondary | ICD-10-CM | POA: Diagnosis not present

## 2022-05-04 DIAGNOSIS — N189 Chronic kidney disease, unspecified: Secondary | ICD-10-CM | POA: Diagnosis not present

## 2022-05-04 DIAGNOSIS — N1832 Chronic kidney disease, stage 3b: Secondary | ICD-10-CM | POA: Diagnosis not present

## 2022-05-12 DIAGNOSIS — E559 Vitamin D deficiency, unspecified: Secondary | ICD-10-CM | POA: Diagnosis not present

## 2022-05-12 DIAGNOSIS — Z87442 Personal history of urinary calculi: Secondary | ICD-10-CM | POA: Diagnosis not present

## 2022-05-12 DIAGNOSIS — N1832 Chronic kidney disease, stage 3b: Secondary | ICD-10-CM | POA: Diagnosis not present

## 2022-05-12 DIAGNOSIS — D631 Anemia in chronic kidney disease: Secondary | ICD-10-CM | POA: Diagnosis not present

## 2022-05-12 DIAGNOSIS — R600 Localized edema: Secondary | ICD-10-CM | POA: Diagnosis not present

## 2022-05-12 DIAGNOSIS — I129 Hypertensive chronic kidney disease with stage 1 through stage 4 chronic kidney disease, or unspecified chronic kidney disease: Secondary | ICD-10-CM | POA: Diagnosis not present

## 2022-06-13 ENCOUNTER — Other Ambulatory Visit: Payer: Self-pay

## 2022-06-13 ENCOUNTER — Inpatient Hospital Stay: Payer: Medicare HMO | Admitting: Hematology and Oncology

## 2022-06-13 ENCOUNTER — Encounter: Payer: Self-pay | Admitting: Hematology and Oncology

## 2022-06-13 ENCOUNTER — Inpatient Hospital Stay: Payer: Medicare HMO | Attending: Hematology and Oncology

## 2022-06-13 VITALS — BP 143/68 | HR 85 | Temp 97.9°F | Resp 16 | Ht 68.0 in | Wt 309.6 lb

## 2022-06-13 DIAGNOSIS — Z79899 Other long term (current) drug therapy: Secondary | ICD-10-CM | POA: Insufficient documentation

## 2022-06-13 DIAGNOSIS — D61818 Other pancytopenia: Secondary | ICD-10-CM | POA: Diagnosis not present

## 2022-06-13 DIAGNOSIS — Z87891 Personal history of nicotine dependence: Secondary | ICD-10-CM | POA: Insufficient documentation

## 2022-06-13 LAB — COMPREHENSIVE METABOLIC PANEL
ALT: 12 U/L (ref 0–44)
AST: 14 U/L — ABNORMAL LOW (ref 15–41)
Albumin: 4.2 g/dL (ref 3.5–5.0)
Alkaline Phosphatase: 59 U/L (ref 38–126)
Anion gap: 9 (ref 5–15)
BUN: 38 mg/dL — ABNORMAL HIGH (ref 8–23)
CO2: 29 mmol/L (ref 22–32)
Calcium: 10.7 mg/dL — ABNORMAL HIGH (ref 8.9–10.3)
Chloride: 100 mmol/L (ref 98–111)
Creatinine, Ser: 1.9 mg/dL — ABNORMAL HIGH (ref 0.61–1.24)
GFR, Estimated: 37 mL/min — ABNORMAL LOW (ref >60)
Glucose, Bld: 157 mg/dL — ABNORMAL HIGH (ref 70–99)
Potassium: 4.1 mmol/L (ref 3.5–5.1)
Sodium: 138 mmol/L (ref 135–145)
Total Bilirubin: 0.6 mg/dL (ref 0.3–1.2)
Total Protein: 7.9 g/dL (ref 6.5–8.1)

## 2022-06-13 LAB — CBC WITH DIFFERENTIAL/PLATELET
Abs Immature Granulocytes: 0.01 10*3/uL (ref 0.00–0.07)
Basophils Absolute: 0.1 10*3/uL (ref 0.0–0.1)
Basophils Relative: 1 %
Eosinophils Absolute: 0.1 10*3/uL (ref 0.0–0.5)
Eosinophils Relative: 2 %
HCT: 38.6 % — ABNORMAL LOW (ref 39.0–52.0)
Hemoglobin: 12.4 g/dL — ABNORMAL LOW (ref 13.0–17.0)
Immature Granulocytes: 0 %
Lymphocytes Relative: 27 %
Lymphs Abs: 1.6 10*3/uL (ref 0.7–4.0)
MCH: 29.5 pg (ref 26.0–34.0)
MCHC: 32.1 g/dL (ref 30.0–36.0)
MCV: 91.9 fL (ref 80.0–100.0)
Monocytes Absolute: 1 10*3/uL (ref 0.1–1.0)
Monocytes Relative: 16 %
Neutro Abs: 3.3 10*3/uL (ref 1.7–7.7)
Neutrophils Relative %: 54 %
Platelets: 171 10*3/uL (ref 150–400)
RBC: 4.2 MIL/uL — ABNORMAL LOW (ref 4.22–5.81)
RDW: 15.2 % (ref 11.5–15.5)
WBC: 6 10*3/uL (ref 4.0–10.5)
nRBC: 0 % (ref 0.0–0.2)

## 2022-06-13 NOTE — Progress Notes (Signed)
[facility] NOTE  Patient Care Team: Gilmore Laroche, MD as PCP - General (Physical Medicine and Rehabilitation)  CHIEF COMPLAINTS/PURPOSE OF CONSULTATION:   Pancytopenia follow-up  ASSESSMENT & PLAN:   This is a very pleasant 71 year old male patient with past medical history significant for hypertension, dyslipidemia, arthritis, type 2 diabetes mellitus referred to hematology for evaluation of pancytopenia.   We do not have any labs between 2013 and 2020 to assess the chronicity of thrombocytopenia for this patient.  We have discussed about monitoring versus proceeding with bone marrow aspiration and biopsy. Lance Gregory was more interested in surveillance.    Since his last visit, Lance Gregory has been doing really.  No new things.  Lance Gregory is trying to maintain his weight.  No interim infections or hospitalizations.  No concerning findings on exam.  We have reviewed labs today showed resolution of thrombocytopenia, mild and stable for the past 1 year.  We have discussed about continuing healthy diet, activity and hydration and Lance Gregory will return to clinic in 6 months or sooner.  Lance Gregory is very satisfied with this plan.   HISTORY OF PRESENTING ILLNESS:   Lance Hausen Sr. 71 y.o. male is here because of pancytopenia.  This is a very pleasant 71 year old male patient past medical history significant for arthritis, type 2 diabetes, dyslipidemia and hypertension questionable rheumatoid arthritis referred to hematology for evaluation of pancytopenia.    Interval History Lance Gregory is here for FU by himself. Lance Gregory had some eye problems, arthritis No hospitalizations, no infections. No bleeding  complaints No B symptoms.  Rest of the pertinent 10 point ROS reviewed and unremarkable.  MEDICAL HISTORY:  Past Medical History:  Diagnosis Date   Arthritis    Back pain    Diabetes mellitus without complication (HCC)    ORAL MEDS-NO INSULIN   GERD (gastroesophageal reflux disease)    OCCAS AFTER EATING  CERTAIN FOODS   Hyperlipidemia    Hypertension    Joint pain    Rheumatoid arthritis ([place])    Shortness of breath    Sleep apnea    Ureteral stone    LEFT  --DISCOMFORT    SURGICAL HISTORY: Past Surgical History:  Procedure Laterality Date   ANAL FISSURE REPAIR     COLONOSCOPY WITH PROPOFOL N/A 03/02/2018   Procedure: COLONOSCOPY WITH PROPOFOL;  Surgeon: Carol Ada, MD;  Location: [facility];  Service: Endoscopy;  Laterality: N/A;   CYSTOSCOPY WITH URETEROSCOPY  05/16/2012   Procedure: CYSTOSCOPY WITH URETEROSCOPY;  Surgeon: Molli Hazard, MD;  Location: [facility];  Service: Urology;  Laterality: Left;  left ureteral stent placement   HOLMIUM LASER APPLICATION  66/[id number]/0347   Procedure: HOLMIUM LASER APPLICATION;  Surgeon: Molli Hazard, MD;  Location: [facility];  Service: Urology;  Laterality: Left;   KNEE ARTHROSCOPY     SEVERAL  SURGERIES BOTH KNEES   POLYPECTOMY  03/02/2018   Procedure: POLYPECTOMY;  Surgeon: Carol Ada, MD;  Location: [facility];  Service: Endoscopy;;   SUBMUCOSAL INJECTION  03/02/2018   Procedure: SUBMUCOSAL INJECTION saline;  Surgeon: Carol Ada, MD;  Location: [facility];  Service: Endoscopy;;   VASCECTOMY      SOCIAL HISTORY: Social History   Socioeconomic History   Marital status: Married    Spouse name: Not on file   Number of children: Not on file   Years of education: Not on file   Highest education level: Not on file  Occupational History   Not on file  Tobacco Use  Smoking status: Former   Smokeless tobacco: Never   Tobacco comments:    QUIT SMOKING MANY YRS AGO  Vaping Use   Vaping Use: Never used  Substance and Sexual Activity   Alcohol use: Yes    Comment: OCCAS   Drug use: No   Sexual activity: Not on file  Other Topics Concern   Not on file  Social History Narrative   ** Merged History Encounter **       Social Determinants of Health   Financial Resource Strain: Not on file  Food Insecurity: Not on  file  Transportation Needs: Not on file  Physical Activity: Not on file  Stress: Not on file  Social Connections: Not on file  Intimate Partner Violence: Not on file    FAMILY HISTORY: Family History  Problem Relation Age of Onset   Hypertension Mother    Obesity Father     ALLERGIES:  is allergic to hydrocodone-acetaminophen, vicodin [hydrocodone-acetaminophen], and codeine.  MEDICATIONS:  Current Outpatient Medications  Medication Sig Dispense Refill   allopurinol (ZYLOPRIM) 300 MG tablet Take 1 tablet by mouth daily.     amLODipine (NORVASC) 10 MG tablet Take 10 mg by mouth every morning.     amLODipine (NORVASC) 10 MG tablet Take 1 tablet by mouth daily.     amLODipine (NORVASC) 5 MG tablet      aspirin 81 MG chewable tablet 1 tablet     aspirin EC 81 MG tablet Take 81 mg by mouth at bedtime.     augmented betamethasone dipropionate (DIPROLENE-AF) 0.05 % cream      carvedilol (COREG) 3.125 MG tablet Take 3.125 mg by mouth 2 (two) times daily with a meal.      clotrimazole-betamethasone (LOTRISONE) cream      Colchicine (MITIGARE) 0.6 MG CAPS Take 1 capsule by mouth daily. 7 capsule 0   colchicine 0.6 MG tablet Take 1 tablet (0.6 mg total) by mouth daily. 7 tablet 0   Continuous Blood Gluc Receiver (FREESTYLE LIBRE 2 READER SYSTM) DEVI 1 each by Does not apply route daily. As directed 1 Device 0   Continuous Blood Gluc Sensor (FREESTYLE LIBRE 14 DAY SENSOR) MISC 1 each by Does not apply route daily. Apply to skin for 14 days then remove 2 each 0   COVID-19 mRNA Vac-TriS, Pfizer, (PFIZER-BIONT COVID-19 VAC-TRIS) SUSP injection Inject into the muscle. 0.3 mL 0   Cyanocobalamin (B-12 COMPLIANCE INJECTION IJ) B-12 Compliance     Cyanocobalamin (VITAMIN B 12) 500 MCG TABS 1 tablet     doxazosin (CARDURA) 4 MG tablet Take 4 mg by mouth at bedtime.     doxazosin (CARDURA) 4 MG tablet Take 1 tablet by mouth at bedtime.     doxycycline (VIBRA-TABS) 100 MG tablet Take 1 tablet (100 mg  total) by mouth 2 (two) times daily. 20 tablet 0   DROPLET PEN NEEDLES 32G X 4 MM MISC      empagliflozin (JARDIANCE) 25 MG TABS tablet Take 25 mg by mouth daily.     ergocalciferol (VITAMIN D2) 1.25 MG (50000 UT) capsule ergocalciferol (vitamin D2) 1,250 mcg (50,000 unit) capsule     FLUZONE HIGH-DOSE QUADRIVALENT 0.7 ML SUSY      furosemide (LASIX) 40 MG tablet Take 40 mg by mouth daily as needed for fluid.     gabapentin (NEURONTIN) 100 MG capsule 1 capsule     glipiZIDE (GLUCOTROL) 5 MG tablet Take 5 mg by mouth once.     latanoprost (XALATAN) 0.005 %  ophthalmic solution      losartan-hydrochlorothiazide (HYZAAR) 100-25 MG tablet Take 1 tablet by mouth daily.      magnesium gluconate (MAGONATE) 500 MG tablet Take 500 mg by mouth 3 (three) times a week.     magnesium gluconate (MAGONATE) 500 MG tablet Take by mouth.     Methylcellulose, Laxative, 500 MG TABS Take 500 mg by mouth daily.     Multiple Vitamin (MULTIVITAMIN WITH MINERALS) TABS Take 1 tablet by mouth daily.     PFIZER COVID-19 VAC BIVALENT injection      pravastatin (PRAVACHOL) 40 MG tablet Take 40 mg by mouth at bedtime.     SHINGRIX injection      traMADol (ULTRAM) 50 MG tablet Ultram 50 mg tablet  Take 1 tablet 3 times a day by oral route as needed for pain for 5 days.     VICTOZA 18 MG/3ML SOPN Inject 0.6 mg into the skin daily.   0   vitamin B-12 (CYANOCOBALAMIN) 500 MCG tablet Take 500 mcg by mouth daily.     No current facility-administered medications for this visit.     PHYSICAL EXAMINATION: ECOG PERFORMANCE STATUS: 0 - Asymptomatic  There were no vitals filed for this visit.   Physical Exam Constitutional:      Appearance: Normal appearance.  Cardiovascular:     Rate and Rhythm: Normal rate and regular rhythm.     Pulses: Normal pulses.     Heart sounds: Normal heart sounds.  Pulmonary:     Effort: Pulmonary effort is normal.     Breath sounds: Normal breath sounds.  Abdominal:     General: Abdomen  is flat. Bowel sounds are normal.     Palpations: Abdomen is soft.  Musculoskeletal:     Cervical back: Normal range of motion and neck supple. No rigidity.  Lymphadenopathy:     Cervical: No cervical adenopathy.  Neurological:     Mental Status: Lance Gregory is alert.     LABORATORY DATA:  I have reviewed the data as listed Lab Results  Component Value Date   WBC 6.0 06/13/2022   HGB 12.4 (L) 06/13/2022   HCT 38.6 (L) 06/13/2022   MCV 91.9 06/13/2022   PLT 171 06/13/2022     Chemistry      Component Value Date/Time   NA 138 12/09/2021 1145   NA 141 02/25/2021 1158   K 4.2 12/09/2021 1145   CL 104 12/09/2021 1145   CO2 26 12/09/2021 1145   BUN 45 (H) 12/09/2021 1145   BUN 42 (H) 02/25/2021 1158   CREATININE 1.99 (H) 12/09/2021 1145   CREATININE 1.56 (H) 09/24/2020 1221   GLU 105 02/06/2019 0000      Component Value Date/Time   CALCIUM 10.2 12/09/2021 1145   ALKPHOS 55 12/09/2021 1145   AST 13 (L) 12/09/2021 1145   AST 21 09/24/2020 1221   ALT 12 12/09/2021 1145   ALT 16 09/24/2020 1221   BILITOT 0.7 12/09/2021 1145   BILITOT 0.8 09/24/2020 1221     I reviewed labs from outside.  His white blood cell count was 3700 hemoglobin of 13.3 and platelet count 106,000 Lance Gregory was noted to have some monocytosis.  Creatinine noted to be 1.6.  Otherwise calcium, total protein and alk phos are normal.  TSH is normal hemoglobin A1c is 5.5, vitamin D of 31.4.  PSA of 0.25.  CBC in September 2020 also showed a platelet count of 111,000 Back in 2013, his white blood cell count was  normal, his platelet count was 212,000 and his hemoglobin was normal.  Folate leves normal Lance Gregory has Hep C antibodies.  Undetectable hepatitis C viral RNA. CKD which is stable essentially B12 is normal Ferritin 284 Most recent CBC with normal WBC, hemoglobin and mildly low platelets Again, we dont have labs between 2013 and 2020 to compare and assess the chronicity of thrombocytopenia.  Labs from 12/09/2021 with a  white blood cell count of 4.9, hemoglobin of 13.5 and platelet count of 131,000. Overall stable thrombocytopenia.  CBC from today reviewed, no leukopenia, hemoglobin 12.4 which is stable over the past year, no thrombocytopenia.  RADIOGRAPHIC STUDIES: I have personally reviewed the radiological images as listed and agreed with the findings in the report. No results found.  All questions were answered. The patient knows to call the clinic with any problems, questions or concerns. I spent 20 minutes in the care of this patient including H and P, review of records, counseling and coordination of care.     Benay Pike, MD 06/13/2022 11:39 AM

## 2022-06-14 DIAGNOSIS — H401131 Primary open-angle glaucoma, bilateral, mild stage: Secondary | ICD-10-CM | POA: Diagnosis not present

## 2022-08-09 DIAGNOSIS — G629 Polyneuropathy, unspecified: Secondary | ICD-10-CM | POA: Diagnosis not present

## 2022-08-09 DIAGNOSIS — L509 Urticaria, unspecified: Secondary | ICD-10-CM | POA: Diagnosis not present

## 2022-09-09 DIAGNOSIS — M47816 Spondylosis without myelopathy or radiculopathy, lumbar region: Secondary | ICD-10-CM | POA: Diagnosis not present

## 2022-09-09 DIAGNOSIS — M170 Bilateral primary osteoarthritis of knee: Secondary | ICD-10-CM | POA: Diagnosis not present

## 2022-09-09 DIAGNOSIS — M1712 Unilateral primary osteoarthritis, left knee: Secondary | ICD-10-CM | POA: Diagnosis not present

## 2022-09-09 DIAGNOSIS — M1711 Unilateral primary osteoarthritis, right knee: Secondary | ICD-10-CM | POA: Diagnosis not present

## 2022-09-19 DIAGNOSIS — M109 Gout, unspecified: Secondary | ICD-10-CM | POA: Diagnosis not present

## 2022-09-19 DIAGNOSIS — N189 Chronic kidney disease, unspecified: Secondary | ICD-10-CM | POA: Diagnosis not present

## 2022-09-19 DIAGNOSIS — Z0000 Encounter for general adult medical examination without abnormal findings: Secondary | ICD-10-CM | POA: Diagnosis not present

## 2022-09-19 DIAGNOSIS — D61818 Other pancytopenia: Secondary | ICD-10-CM | POA: Diagnosis not present

## 2022-09-19 DIAGNOSIS — R635 Abnormal weight gain: Secondary | ICD-10-CM | POA: Diagnosis not present

## 2022-09-19 DIAGNOSIS — E1122 Type 2 diabetes mellitus with diabetic chronic kidney disease: Secondary | ICD-10-CM | POA: Diagnosis not present

## 2022-09-19 DIAGNOSIS — E7800 Pure hypercholesterolemia, unspecified: Secondary | ICD-10-CM | POA: Diagnosis not present

## 2022-09-19 DIAGNOSIS — Z125 Encounter for screening for malignant neoplasm of prostate: Secondary | ICD-10-CM | POA: Diagnosis not present

## 2022-09-19 DIAGNOSIS — E559 Vitamin D deficiency, unspecified: Secondary | ICD-10-CM | POA: Diagnosis not present

## 2022-10-13 DIAGNOSIS — M47816 Spondylosis without myelopathy or radiculopathy, lumbar region: Secondary | ICD-10-CM | POA: Diagnosis not present

## 2022-10-13 DIAGNOSIS — M47817 Spondylosis without myelopathy or radiculopathy, lumbosacral region: Secondary | ICD-10-CM | POA: Diagnosis not present

## 2022-10-17 DIAGNOSIS — N183 Chronic kidney disease, stage 3 unspecified: Secondary | ICD-10-CM | POA: Diagnosis not present

## 2022-10-17 DIAGNOSIS — E1122 Type 2 diabetes mellitus with diabetic chronic kidney disease: Secondary | ICD-10-CM | POA: Diagnosis not present

## 2022-10-17 DIAGNOSIS — D61818 Other pancytopenia: Secondary | ICD-10-CM | POA: Diagnosis not present

## 2022-10-17 DIAGNOSIS — M1990 Unspecified osteoarthritis, unspecified site: Secondary | ICD-10-CM | POA: Diagnosis not present

## 2022-10-17 DIAGNOSIS — I10 Essential (primary) hypertension: Secondary | ICD-10-CM | POA: Diagnosis not present

## 2022-10-17 DIAGNOSIS — E559 Vitamin D deficiency, unspecified: Secondary | ICD-10-CM | POA: Diagnosis not present

## 2022-10-17 DIAGNOSIS — Z0000 Encounter for general adult medical examination without abnormal findings: Secondary | ICD-10-CM | POA: Diagnosis not present

## 2022-10-17 DIAGNOSIS — E7800 Pure hypercholesterolemia, unspecified: Secondary | ICD-10-CM | POA: Diagnosis not present

## 2022-10-17 DIAGNOSIS — M109 Gout, unspecified: Secondary | ICD-10-CM | POA: Diagnosis not present

## 2022-10-18 ENCOUNTER — Encounter: Payer: Self-pay | Admitting: Hematology and Oncology

## 2022-10-18 ENCOUNTER — Telehealth: Payer: Self-pay | Admitting: Hematology and Oncology

## 2022-10-18 NOTE — Telephone Encounter (Signed)
Spoke with patient confirming appointment change  

## 2022-10-24 ENCOUNTER — Telehealth: Payer: Self-pay | Admitting: Hematology and Oncology

## 2022-10-31 DIAGNOSIS — N1832 Chronic kidney disease, stage 3b: Secondary | ICD-10-CM | POA: Diagnosis not present

## 2022-11-07 ENCOUNTER — Other Ambulatory Visit: Payer: Self-pay | Admitting: *Deleted

## 2022-11-07 DIAGNOSIS — D61818 Other pancytopenia: Secondary | ICD-10-CM

## 2022-11-08 ENCOUNTER — Other Ambulatory Visit: Payer: Self-pay

## 2022-11-08 ENCOUNTER — Encounter: Payer: Self-pay | Admitting: Hematology and Oncology

## 2022-11-08 ENCOUNTER — Inpatient Hospital Stay (HOSPITAL_BASED_OUTPATIENT_CLINIC_OR_DEPARTMENT_OTHER): Payer: Medicare HMO | Admitting: Hematology and Oncology

## 2022-11-08 ENCOUNTER — Inpatient Hospital Stay: Payer: Medicare HMO | Attending: Hematology and Oncology

## 2022-11-08 VITALS — BP 154/66 | HR 73 | Temp 97.9°F | Resp 18 | Wt 323.3 lb

## 2022-11-08 DIAGNOSIS — D61818 Other pancytopenia: Secondary | ICD-10-CM | POA: Insufficient documentation

## 2022-11-08 DIAGNOSIS — Z87891 Personal history of nicotine dependence: Secondary | ICD-10-CM | POA: Diagnosis not present

## 2022-11-08 DIAGNOSIS — D696 Thrombocytopenia, unspecified: Secondary | ICD-10-CM | POA: Insufficient documentation

## 2022-11-08 DIAGNOSIS — Z79899 Other long term (current) drug therapy: Secondary | ICD-10-CM | POA: Diagnosis not present

## 2022-11-08 DIAGNOSIS — M069 Rheumatoid arthritis, unspecified: Secondary | ICD-10-CM | POA: Diagnosis not present

## 2022-11-08 LAB — CBC WITH DIFFERENTIAL (CANCER CENTER ONLY)
Abs Immature Granulocytes: 0.01 10*3/uL (ref 0.00–0.07)
Basophils Absolute: 0 10*3/uL (ref 0.0–0.1)
Basophils Relative: 1 %
Eosinophils Absolute: 0.1 10*3/uL (ref 0.0–0.5)
Eosinophils Relative: 3 %
HCT: 40.8 % (ref 39.0–52.0)
Hemoglobin: 13 g/dL (ref 13.0–17.0)
Immature Granulocytes: 0 %
Lymphocytes Relative: 35 %
Lymphs Abs: 1.5 10*3/uL (ref 0.7–4.0)
MCH: 29.4 pg (ref 26.0–34.0)
MCHC: 31.9 g/dL (ref 30.0–36.0)
MCV: 92.3 fL (ref 80.0–100.0)
Monocytes Absolute: 0.7 10*3/uL (ref 0.1–1.0)
Monocytes Relative: 17 %
Neutro Abs: 1.8 10*3/uL (ref 1.7–7.7)
Neutrophils Relative %: 44 %
Platelet Count: 139 10*3/uL — ABNORMAL LOW (ref 150–400)
RBC: 4.42 MIL/uL (ref 4.22–5.81)
RDW: 15.6 % — ABNORMAL HIGH (ref 11.5–15.5)
WBC Count: 4.2 10*3/uL (ref 4.0–10.5)
nRBC: 0 % (ref 0.0–0.2)

## 2022-11-08 LAB — CMP (CANCER CENTER ONLY)
ALT: 16 U/L (ref 0–44)
AST: 19 U/L (ref 15–41)
Albumin: 4 g/dL (ref 3.5–5.0)
Alkaline Phosphatase: 51 U/L (ref 38–126)
Anion gap: 10 (ref 5–15)
BUN: 42 mg/dL — ABNORMAL HIGH (ref 8–23)
CO2: 24 mmol/L (ref 22–32)
Calcium: 9.6 mg/dL (ref 8.9–10.3)
Chloride: 104 mmol/L (ref 98–111)
Creatinine: 2.08 mg/dL — ABNORMAL HIGH (ref 0.61–1.24)
GFR, Estimated: 33 mL/min — ABNORMAL LOW (ref >60)
Glucose, Bld: 165 mg/dL — ABNORMAL HIGH (ref 70–99)
Potassium: 3.7 mmol/L (ref 3.5–5.1)
Sodium: 138 mmol/L (ref 135–145)
Total Bilirubin: 0.7 mg/dL (ref 0.3–1.2)
Total Protein: 7.4 g/dL (ref 6.5–8.1)

## 2022-11-08 NOTE — Progress Notes (Signed)
Lance Gregory CONSULT NOTE  Patient Care Team: Herold Harms, MD as PCP - General (Physical Medicine and Rehabilitation)  CHIEF COMPLAINTS/PURPOSE OF CONSULTATION:   Pancytopenia follow-up  ASSESSMENT & PLAN:   This is a very pleasant 72 year old male patient with past medical history significant for hypertension, dyslipidemia, arthritis, type 2 diabetes mellitus referred to hematology for evaluation of pancytopenia.   We do not have any labs between 2013 and 2020 to assess the chronicity of thrombocytopenia for this patient.  We have discussed about monitoring versus proceeding with bone marrow aspiration and biopsy. He was more interested in surveillance.    Since his last visit, he has been doing really well. No interim infections or hospitalizations.  No concerning findings on exam.  We have reviewed labs today showed stable and mild thrombocytopenia. No concerns on exam. We have discussed about continuing healthy diet, activity and hydration and he will return to clinic in 6 months or sooner.  He is very satisfied with this plan.   HISTORY OF PRESENTING ILLNESS:   Lance Gregory. 72 y.o. male is here because of pancytopenia.  This is a very pleasant 72 year old male patient past medical history significant for arthritis, type 2 diabetes, dyslipidemia and hypertension questionable rheumatoid arthritis referred to hematology for evaluation of pancytopenia.    Interval History  He is here for FU by himself. He had some injections to his back for back pain. No hospitalizations, no infections. No bleeding  complaints. No B symptoms.  Rest of the pertinent 10 point ROS reviewed and unremarkable.  MEDICAL HISTORY:  Past Medical History:  Diagnosis Date   Arthritis    Back pain    Diabetes mellitus without complication (HCC)    ORAL MEDS-NO INSULIN   GERD (gastroesophageal reflux disease)    OCCAS AFTER EATING CERTAIN FOODS   Hyperlipidemia    Hypertension     Joint pain    Rheumatoid arthritis ([facility])    Shortness of breath    Sleep apnea    Ureteral stone    LEFT  --DISCOMFORT    SURGICAL HISTORY: Past Surgical History:  Procedure Laterality Date   ANAL FISSURE REPAIR     COLONOSCOPY WITH PROPOFOL N/A 03/02/2018   Procedure: COLONOSCOPY WITH PROPOFOL;  Surgeon: Jeani Hawking, MD;  Location: [facility];  Service: Endoscopy;  Laterality: N/A;   CYSTOSCOPY WITH URETEROSCOPY  05/16/2012   Procedure: CYSTOSCOPY WITH URETEROSCOPY;  Surgeon: Milford Cage, MD;  Location: [facility];  Service: Urology;  Laterality: Left;  left ureteral stent placement   HOLMIUM LASER APPLICATION  05/16/2012   Procedure: HOLMIUM LASER APPLICATION;  Surgeon: Milford Cage, MD;  Location: [facility];  Service: Urology;  Laterality: Left;   KNEE ARTHROSCOPY     SEVERAL  SURGERIES BOTH KNEES   POLYPECTOMY  03/02/2018   Procedure: POLYPECTOMY;  Surgeon: Jeani Hawking, MD;  Location: [facility];  Service: Endoscopy;;   SUBMUCOSAL INJECTION  03/02/2018   Procedure: SUBMUCOSAL INJECTION saline;  Surgeon: Jeani Hawking, MD;  Location: [facility];  Service: Endoscopy;;   VASCECTOMY      SOCIAL HISTORY: Social History   Socioeconomic History   Marital status: Married    Spouse name: Not on file   Number of children: Not on file   Years of education: Not on file   Highest education level: Not on file  Occupational History   Not on file  Tobacco Use   Smoking status: Former   Smokeless tobacco: Never  Tobacco comments:    QUIT SMOKING MANY YRS AGO  Vaping Use   Vaping Use: Never used  Substance and Sexual Activity   Alcohol use: Yes    Comment: OCCAS   Drug use: No   Sexual activity: Not on file  Other Topics Concern   Not on file  Social History Narrative   ** Merged History Encounter **       Social Determinants of Health   Financial Resource Strain: Not on file  Food Insecurity: Not on file  Transportation Needs: Not on file   Physical Activity: Not on file  Stress: Not on file  Social Connections: Not on file  Intimate Partner Violence: Not on file    FAMILY HISTORY: Family History  Problem Relation Age of Onset   Hypertension Mother    Obesity Father     ALLERGIES:  is allergic to hydrocodone-acetaminophen, vicodin [hydrocodone-acetaminophen], and codeine.  MEDICATIONS:  Current Outpatient Medications  Medication Sig Dispense Refill   allopurinol (ZYLOPRIM) 300 MG tablet Take 1 tablet by mouth daily.     amLODipine (NORVASC) 5 MG tablet      aspirin EC 81 MG tablet Take 81 mg by mouth at bedtime.     augmented betamethasone dipropionate (DIPROLENE-AF) 0.05 % cream      carvedilol (COREG) 3.125 MG tablet Take 3.125 mg by mouth 2 (two) times daily with a meal.      clotrimazole-betamethasone (LOTRISONE) cream      Colchicine (MITIGARE) 0.6 MG CAPS Take 1 capsule by mouth daily. 7 capsule 0   colchicine 0.6 MG tablet Take 1 tablet (0.6 mg total) by mouth daily. 7 tablet 0   Continuous Blood Gluc Receiver (FREESTYLE LIBRE 2 READER SYSTM) DEVI 1 each by Does not apply route daily. As directed 1 Device 0   Continuous Blood Gluc Sensor (FREESTYLE LIBRE 14 DAY SENSOR) MISC 1 each by Does not apply route daily. Apply to skin for 14 days then remove 2 each 0   COVID-19 mRNA Vac-TriS, Pfizer, (PFIZER-BIONT COVID-19 VAC-TRIS) SUSP injection Inject into the muscle. 0.3 mL 0   Cyanocobalamin (B-12 COMPLIANCE INJECTION IJ) B-12 Compliance     Cyanocobalamin (VITAMIN B 12) 500 MCG TABS 1 tablet     doxazosin (CARDURA) 4 MG tablet Take 4 mg by mouth at bedtime.     doxazosin (CARDURA) 4 MG tablet Take 1 tablet by mouth at bedtime.     doxycycline (VIBRA-TABS) 100 MG tablet Take 1 tablet (100 mg total) by mouth 2 (two) times daily. 20 tablet 0   DROPLET PEN NEEDLES 32G X 4 MM MISC      empagliflozin (JARDIANCE) 25 MG TABS tablet Take 25 mg by mouth daily.     ergocalciferol (VITAMIN D2) 1.25 MG (50000 UT) capsule  ergocalciferol (vitamin D2) 1,250 mcg (50,000 unit) capsule     FLUZONE HIGH-DOSE QUADRIVALENT 0.7 ML SUSY      gabapentin (NEURONTIN) 100 MG capsule 1 capsule     glipiZIDE (GLUCOTROL) 5 MG tablet Take 5 mg by mouth once.     latanoprost (XALATAN) 0.005 % ophthalmic solution      losartan-hydrochlorothiazide (HYZAAR) 100-25 MG tablet Take 1 tablet by mouth daily.      magnesium gluconate (MAGONATE) 500 MG tablet Take 500 mg by mouth 3 (three) times a week.     magnesium gluconate (MAGONATE) 500 MG tablet Take by mouth.     Methylcellulose, Laxative, 500 MG TABS Take 500 mg by mouth daily.  Multiple Vitamin (MULTIVITAMIN WITH MINERALS) TABS Take 1 tablet by mouth daily.     PFIZER COVID-19 VAC BIVALENT injection      pravastatin (PRAVACHOL) 40 MG tablet Take 40 mg by mouth at bedtime.     SHINGRIX injection      traMADol (ULTRAM) 50 MG tablet Ultram 50 mg tablet  Take 1 tablet 3 times a day by oral route as needed for pain for 5 days.     VICTOZA 18 MG/3ML SOPN Inject 0.6 mg into the skin daily.   0   vitamin B-12 (CYANOCOBALAMIN) 500 MCG tablet Take 500 mcg by mouth daily.     No current facility-administered medications for this visit.     PHYSICAL EXAMINATION: ECOG PERFORMANCE STATUS: 0 - Asymptomatic  Vitals:   11/08/22 1431  BP: (!) 154/66  Pulse: 73  Resp: 18  Temp: 97.9 F (36.6 C)  SpO2: 94%     Physical Exam Constitutional:      Appearance: Normal appearance.  Cardiovascular:     Rate and Rhythm: Normal rate and regular rhythm.     Pulses: Normal pulses.     Heart sounds: Normal heart sounds.  Pulmonary:     Effort: Pulmonary effort is normal.     Breath sounds: Normal breath sounds.  Abdominal:     General: Abdomen is flat. Bowel sounds are normal.     Palpations: Abdomen is soft.  Musculoskeletal:     Cervical back: Normal range of motion and neck supple. No rigidity.  Lymphadenopathy:     Cervical: No cervical adenopathy.  Neurological:     Mental  Status: He is alert.      LABORATORY DATA:  I have reviewed the data as listed Lab Results  Component Value Date   WBC 4.2 11/08/2022   HGB 13.0 11/08/2022   HCT 40.8 11/08/2022   MCV 92.3 11/08/2022   PLT 139 (L) 11/08/2022     Chemistry      Component Value Date/Time   NA 138 11/08/2022 0926   NA 141 02/25/2021 1158   K 3.7 11/08/2022 0926   CL 104 11/08/2022 0926   CO2 24 11/08/2022 0926   BUN 42 (H) 11/08/2022 0926   BUN 42 (H) 02/25/2021 1158   CREATININE 2.08 (H) 11/08/2022 0926   GLU 105 02/06/2019 0000      Component Value Date/Time   CALCIUM 9.6 11/08/2022 0926   ALKPHOS 51 11/08/2022 0926   AST 19 11/08/2022 0926   ALT 16 11/08/2022 0926   BILITOT 0.7 11/08/2022 0926     I reviewed labs from outside.  His white blood cell count was 3700 hemoglobin of 13.3 and platelet count 106,000 He was noted to have some monocytosis.  Creatinine noted to be 1.6.  Otherwise calcium, total protein and alk phos are normal.  TSH is normal hemoglobin A1c is 5.5, vitamin D of 31.4.  PSA of 0.25.  CBC in September 2020 also showed a platelet count of 111,000 Back in 2013, his white blood cell count was normal, his platelet count was 212,000 and his hemoglobin was normal.  Folate leves normal He has Hep C antibodies.  Undetectable hepatitis C viral RNA. CKD which is stable essentially B12 is normal Ferritin 284 Again, we dont have labs between 2013 and 2020 to compare and assess the chronicity of thrombocytopenia.  Labs from 12/09/2021 with a white blood cell count of 4.9, hemoglobin of 13.5 and platelet count of 131,000. Overall stable thrombocytopenia.  CBC from  today reviewed, no leukopenia, hemoglobin normal, mild thrombocytopenia. CMP , creatinine at 2, has a FU with his kidney doctor.  RADIOGRAPHIC STUDIES: I have personally reviewed the radiological images as listed and agreed with the findings in the report. No results found.  All questions were answered. The  patient knows to call the clinic with any problems, questions or concerns. I spent 20 minutes in the care of this patient including H and P, review of records, counseling and coordination of care.     Rachel Moulds, MD 11/08/2022 2:52 PM

## 2022-11-09 ENCOUNTER — Telehealth: Payer: Self-pay | Admitting: Hematology and Oncology

## 2022-11-09 NOTE — Telephone Encounter (Signed)
Spoke with patient confirming upcoming appointments  

## 2022-11-11 ENCOUNTER — Ambulatory Visit: Payer: Medicare HMO | Admitting: Hematology and Oncology

## 2022-11-11 ENCOUNTER — Other Ambulatory Visit: Payer: Medicare HMO

## 2022-11-11 DIAGNOSIS — R600 Localized edema: Secondary | ICD-10-CM | POA: Diagnosis not present

## 2022-11-11 DIAGNOSIS — N400 Enlarged prostate without lower urinary tract symptoms: Secondary | ICD-10-CM | POA: Diagnosis not present

## 2022-11-11 DIAGNOSIS — Z87442 Personal history of urinary calculi: Secondary | ICD-10-CM | POA: Diagnosis not present

## 2022-11-11 DIAGNOSIS — E559 Vitamin D deficiency, unspecified: Secondary | ICD-10-CM | POA: Diagnosis not present

## 2022-11-11 DIAGNOSIS — D631 Anemia in chronic kidney disease: Secondary | ICD-10-CM | POA: Diagnosis not present

## 2022-11-11 DIAGNOSIS — N1832 Chronic kidney disease, stage 3b: Secondary | ICD-10-CM | POA: Diagnosis not present

## 2022-11-11 DIAGNOSIS — I129 Hypertensive chronic kidney disease with stage 1 through stage 4 chronic kidney disease, or unspecified chronic kidney disease: Secondary | ICD-10-CM | POA: Diagnosis not present

## 2022-11-22 DIAGNOSIS — E119 Type 2 diabetes mellitus without complications: Secondary | ICD-10-CM | POA: Diagnosis not present

## 2022-12-13 ENCOUNTER — Ambulatory Visit: Payer: Medicare HMO | Admitting: Hematology and Oncology

## 2022-12-13 ENCOUNTER — Other Ambulatory Visit: Payer: Medicare HMO

## 2022-12-21 DIAGNOSIS — N281 Cyst of kidney, acquired: Secondary | ICD-10-CM | POA: Diagnosis not present

## 2022-12-21 DIAGNOSIS — Z87442 Personal history of urinary calculi: Secondary | ICD-10-CM | POA: Diagnosis not present

## 2023-02-15 DIAGNOSIS — E119 Type 2 diabetes mellitus without complications: Secondary | ICD-10-CM | POA: Diagnosis not present

## 2023-02-15 DIAGNOSIS — Z1211 Encounter for screening for malignant neoplasm of colon: Secondary | ICD-10-CM | POA: Diagnosis not present

## 2023-02-15 DIAGNOSIS — I10 Essential (primary) hypertension: Secondary | ICD-10-CM | POA: Diagnosis not present

## 2023-03-02 DIAGNOSIS — H401131 Primary open-angle glaucoma, bilateral, mild stage: Secondary | ICD-10-CM | POA: Diagnosis not present

## 2023-03-07 DIAGNOSIS — E559 Vitamin D deficiency, unspecified: Secondary | ICD-10-CM | POA: Diagnosis not present

## 2023-03-07 DIAGNOSIS — I129 Hypertensive chronic kidney disease with stage 1 through stage 4 chronic kidney disease, or unspecified chronic kidney disease: Secondary | ICD-10-CM | POA: Diagnosis not present

## 2023-03-07 DIAGNOSIS — D631 Anemia in chronic kidney disease: Secondary | ICD-10-CM | POA: Diagnosis not present

## 2023-03-07 DIAGNOSIS — N1832 Chronic kidney disease, stage 3b: Secondary | ICD-10-CM | POA: Diagnosis not present

## 2023-03-07 DIAGNOSIS — R600 Localized edema: Secondary | ICD-10-CM | POA: Diagnosis not present

## 2023-04-04 ENCOUNTER — Telehealth: Payer: Self-pay | Admitting: Hematology and Oncology

## 2023-04-04 NOTE — Telephone Encounter (Signed)
Left patient a message regarding scheduled appointment times/dates due to provider being out of office on 05/12/2023; left callback number for main line if needing rescheduling

## 2023-04-11 DIAGNOSIS — D61818 Other pancytopenia: Secondary | ICD-10-CM | POA: Diagnosis not present

## 2023-04-11 DIAGNOSIS — E7800 Pure hypercholesterolemia, unspecified: Secondary | ICD-10-CM | POA: Diagnosis not present

## 2023-04-11 DIAGNOSIS — M109 Gout, unspecified: Secondary | ICD-10-CM | POA: Diagnosis not present

## 2023-04-11 DIAGNOSIS — E1122 Type 2 diabetes mellitus with diabetic chronic kidney disease: Secondary | ICD-10-CM | POA: Diagnosis not present

## 2023-04-18 DIAGNOSIS — M109 Gout, unspecified: Secondary | ICD-10-CM | POA: Diagnosis not present

## 2023-04-18 DIAGNOSIS — M1990 Unspecified osteoarthritis, unspecified site: Secondary | ICD-10-CM | POA: Diagnosis not present

## 2023-04-18 DIAGNOSIS — N390 Urinary tract infection, site not specified: Secondary | ICD-10-CM | POA: Diagnosis not present

## 2023-04-18 DIAGNOSIS — E7800 Pure hypercholesterolemia, unspecified: Secondary | ICD-10-CM | POA: Diagnosis not present

## 2023-04-18 DIAGNOSIS — E1122 Type 2 diabetes mellitus with diabetic chronic kidney disease: Secondary | ICD-10-CM | POA: Diagnosis not present

## 2023-04-18 DIAGNOSIS — I10 Essential (primary) hypertension: Secondary | ICD-10-CM | POA: Diagnosis not present

## 2023-04-18 DIAGNOSIS — E559 Vitamin D deficiency, unspecified: Secondary | ICD-10-CM | POA: Diagnosis not present

## 2023-04-18 DIAGNOSIS — D61818 Other pancytopenia: Secondary | ICD-10-CM | POA: Diagnosis not present

## 2023-04-18 DIAGNOSIS — N183 Chronic kidney disease, stage 3 unspecified: Secondary | ICD-10-CM | POA: Diagnosis not present

## 2023-05-03 ENCOUNTER — Other Ambulatory Visit: Payer: Self-pay | Admitting: Gastroenterology

## 2023-05-05 ENCOUNTER — Encounter: Payer: Self-pay | Admitting: Podiatry

## 2023-05-05 ENCOUNTER — Ambulatory Visit: Payer: Medicare HMO | Admitting: Podiatry

## 2023-05-05 ENCOUNTER — Ambulatory Visit: Payer: Medicare HMO

## 2023-05-05 DIAGNOSIS — L309 Dermatitis, unspecified: Secondary | ICD-10-CM

## 2023-05-05 DIAGNOSIS — M2011 Hallux valgus (acquired), right foot: Secondary | ICD-10-CM

## 2023-05-05 DIAGNOSIS — B351 Tinea unguium: Secondary | ICD-10-CM | POA: Diagnosis not present

## 2023-05-05 MED ORDER — TERBINAFINE HCL 250 MG PO TABS: 250.0000 mg | ORAL_TABLET | Freq: Every day | ORAL | 0 refills | Status: AC

## 2023-05-05 MED ORDER — CLOTRIMAZOLE-BETAMETHASONE 1-0.05 % EX CREA: 1.0000 | TOPICAL_CREAM | Freq: Every day | CUTANEOUS | 0 refills | Status: AC

## 2023-05-05 NOTE — Progress Notes (Signed)
Subjective:   Patient ID: Lance Lew Sr., male   DOB: 72 y.o.   MRN: [id number]   HPI Patient states he has developed something on the bottom of his right foot and is a diabetic has obesity and is not able to see it and he is concerned about what it is   ROS      Objective:  Physical Exam  Neurovascular status intact no significant change in health history from previous with moderate edema in his feet negative Denna Haggard' sign or pitting type edema.  Plantar aspect right there is a flat area measuring about 2.5 cm x 2 cm localized no drainage associated with it no swelling     Assessment:  Probability for a skin infection right plantar foot that appears to be fungal over bacterial with no indication of spread     Plan:  H&P reviewed discussed with him this condition reviewed different treatment options and due to the condition organ to treated conservatively.  I am going to start him on 15 days of oral Lamisil and also Lotrisone cream to be used due to the itch complex and I gave him strict instructions of anything were to occur to reappoint immediately

## 2023-05-12 ENCOUNTER — Other Ambulatory Visit: Payer: Medicare HMO

## 2023-05-12 ENCOUNTER — Ambulatory Visit: Payer: Medicare HMO | Admitting: Hematology and Oncology

## 2023-05-19 ENCOUNTER — Other Ambulatory Visit: Payer: Medicare HMO

## 2023-05-19 ENCOUNTER — Ambulatory Visit: Payer: Medicare HMO | Admitting: Hematology and Oncology

## 2023-05-26 ENCOUNTER — Other Ambulatory Visit: Payer: Self-pay

## 2023-05-26 DIAGNOSIS — D649 Anemia, unspecified: Secondary | ICD-10-CM

## 2023-05-29 ENCOUNTER — Inpatient Hospital Stay: Payer: Medicare HMO | Attending: Hematology and Oncology

## 2023-05-29 ENCOUNTER — Inpatient Hospital Stay: Payer: Medicare HMO | Admitting: Hematology and Oncology

## 2023-05-29 VITALS — BP 144/71 | HR 76 | Temp 97.7°F | Resp 16 | Wt 330.6 lb

## 2023-05-29 DIAGNOSIS — Z87891 Personal history of nicotine dependence: Secondary | ICD-10-CM | POA: Diagnosis not present

## 2023-05-29 DIAGNOSIS — D61818 Other pancytopenia: Secondary | ICD-10-CM | POA: Diagnosis not present

## 2023-05-29 DIAGNOSIS — E1159 Type 2 diabetes mellitus with other circulatory complications: Secondary | ICD-10-CM | POA: Insufficient documentation

## 2023-05-29 DIAGNOSIS — I10 Essential (primary) hypertension: Secondary | ICD-10-CM | POA: Diagnosis not present

## 2023-05-29 DIAGNOSIS — D696 Thrombocytopenia, unspecified: Secondary | ICD-10-CM | POA: Insufficient documentation

## 2023-05-29 DIAGNOSIS — Z79899 Other long term (current) drug therapy: Secondary | ICD-10-CM | POA: Diagnosis not present

## 2023-05-29 DIAGNOSIS — Z7984 Long term (current) use of oral hypoglycemic drugs: Secondary | ICD-10-CM | POA: Diagnosis not present

## 2023-05-29 DIAGNOSIS — Z7985 Long-term (current) use of injectable non-insulin antidiabetic drugs: Secondary | ICD-10-CM | POA: Insufficient documentation

## 2023-05-29 DIAGNOSIS — D649 Anemia, unspecified: Secondary | ICD-10-CM

## 2023-05-29 LAB — CBC WITH DIFFERENTIAL (CANCER CENTER ONLY)
Abs Immature Granulocytes: 0 10*3/uL (ref 0.00–0.07)
Basophils Absolute: 0 10*3/uL (ref 0.0–0.1)
Basophils Relative: 1 %
Eosinophils Absolute: 0.1 10*3/uL (ref 0.0–0.5)
Eosinophils Relative: 3 %
HCT: 41.2 % (ref 39.0–52.0)
Hemoglobin: 13 g/dL (ref 13.0–17.0)
Immature Granulocytes: 0 %
Lymphocytes Relative: 34 %
Lymphs Abs: 1.6 10*3/uL (ref 0.7–4.0)
MCH: 29.4 pg (ref 26.0–34.0)
MCHC: 31.6 g/dL (ref 30.0–36.0)
MCV: 93.2 fL (ref 80.0–100.0)
Monocytes Absolute: 0.7 10*3/uL (ref 0.1–1.0)
Monocytes Relative: 15 %
Neutro Abs: 2.3 10*3/uL (ref 1.7–7.7)
Neutrophils Relative %: 47 %
Platelet Count: 132 10*3/uL — ABNORMAL LOW (ref 150–400)
RBC: 4.42 MIL/uL (ref 4.22–5.81)
RDW: 14.6 % (ref 11.5–15.5)
WBC Count: 4.8 10*3/uL (ref 4.0–10.5)
nRBC: 0 % (ref 0.0–0.2)

## 2023-05-29 LAB — CMP (CANCER CENTER ONLY)
ALT: 13 U/L (ref 0–44)
AST: 17 U/L (ref 15–41)
Albumin: 4.3 g/dL (ref 3.5–5.0)
Alkaline Phosphatase: 46 U/L (ref 38–126)
Anion gap: 7 (ref 5–15)
BUN: 41 mg/dL — ABNORMAL HIGH (ref 8–23)
CO2: 27 mmol/L (ref 22–32)
Calcium: 10.1 mg/dL (ref 8.9–10.3)
Chloride: 103 mmol/L (ref 98–111)
Creatinine: 2.31 mg/dL — ABNORMAL HIGH (ref 0.61–1.24)
GFR, Estimated: 29 mL/min — ABNORMAL LOW (ref >60)
Glucose, Bld: 139 mg/dL — ABNORMAL HIGH (ref 70–99)
Potassium: 3.9 mmol/L (ref 3.5–5.1)
Sodium: 137 mmol/L (ref 135–145)
Total Bilirubin: 0.6 mg/dL (ref <1.2)
Total Protein: 7.4 g/dL (ref 6.5–8.1)

## 2023-05-29 NOTE — Progress Notes (Signed)
[name] CONSULT NOTE  Patient Care Team: Lance Harms, MD as PCP - General (Physical Medicine and Rehabilitation)  CHIEF COMPLAINTS/PURPOSE OF CONSULTATION:   Pancytopenia follow-up  ASSESSMENT & PLAN:   This is a very pleasant 72 year old male patient with past medical history significant for hypertension, dyslipidemia, arthritis, type 2 diabetes mellitus referred to hematology for evaluation of pancytopenia.    Diabetes Stable control. Medication change from Victoza to [facility]. [facility] as prescribed.  Arthritis Persistent symptoms despite previous interventions. Patient has decided to manage symptoms conservatively. -Continue current management.  Thrombocytopenia Mildly low platelets at 132,000. Stable compared to previous values. -Monitor platelet count annually.  Medication changes -Update medication list to reflect changes.  Follow-up Stable condition. -Annual follow-up visit scheduled.   HISTORY OF PRESENTING ILLNESS:   Lance Lew Sr. 72 y.o. male is here because of pancytopenia.  This is a very pleasant 72 year old male patient past medical history significant for arthritis, type 2 diabetes, dyslipidemia and hypertension questionable rheumatoid arthritis referred to hematology for evaluation of pancytopenia.    Discussed the use of AI scribe software for clinical note transcription with the patient, who gave verbal consent to proceed.  History of Present Illness          The patient, with a history of diabetes and arthritis, presents for a routine follow-up. He reports no significant changes in his health status, describing it as "same old, same old." He has lost some weight, which is attributed to his diabetes management. His arthritis remains a constant issue, with previous attempts at injections providing no relief. He has resigned to "live with some stuff the rest of my days," including his "bad knees."  He has seen  multiple doctors and has made changes to his medication regimen. He no longer takes Victoza, baby aspirin, or colchicine. He is due to start [facility], which is expected to aid in weight loss. He has also received a vaccine for shingles.  Rest of the pertinent 10 point ROS reviewed and unremarkable.  MEDICAL HISTORY:  Past Medical History:  Diagnosis Date   Arthritis    Back pain    Diabetes mellitus without complication (HCC)    ORAL MEDS-NO INSULIN   GERD (gastroesophageal reflux disease)    OCCAS AFTER EATING CERTAIN FOODS   Hyperlipidemia    Hypertension    Joint pain    Rheumatoid arthritis (HCC)    Shortness of breath    Sleep apnea    Ureteral stone    LEFT  --DISCOMFORT    SURGICAL HISTORY: Past Surgical History:  Procedure Laterality Date   ANAL FISSURE REPAIR     COLONOSCOPY WITH PROPOFOL N/A 03/02/2018   Procedure: COLONOSCOPY WITH PROPOFOL;  Surgeon: Jeani Hawking, MD;  Location: [facility];  Service: Endoscopy;  Laterality: N/A;   CYSTOSCOPY WITH URETEROSCOPY  05/16/2012   Procedure: CYSTOSCOPY WITH URETEROSCOPY;  Surgeon: Milford Cage, MD;  Location: [facility];  Service: Urology;  Laterality: Left;  left ureteral stent placement   HOLMIUM LASER APPLICATION  05/16/2012   Procedure: HOLMIUM LASER APPLICATION;  Surgeon: Milford Cage, MD;  Location: [facility];  Service: Urology;  Laterality: Left;   KNEE ARTHROSCOPY     SEVERAL  SURGERIES BOTH KNEES   POLYPECTOMY  03/02/2018   Procedure: POLYPECTOMY;  Surgeon: Jeani Hawking, MD;  Location: [facility];  Service: Endoscopy;;   SUBMUCOSAL INJECTION  03/02/2018   Procedure: SUBMUCOSAL INJECTION saline;  Surgeon: Jeani Hawking, MD;  Location: [facility];  Service: Endoscopy;;   VASCECTOMY      SOCIAL HISTORY: Social History   Socioeconomic History   Marital status: Married    Spouse name: Not on file   Number of children: Not on file   Years of education: Not on file   Highest education level: Not  on file  Occupational History   Not on file  Tobacco Use   Smoking status: Former   Smokeless tobacco: Never   Tobacco comments:    QUIT SMOKING MANY YRS AGO  Vaping Use   Vaping status: Never Used  Substance and Sexual Activity   Alcohol use: Not Currently    Comment: OCCAS   Drug use: No   Sexual activity: Not on file  Other Topics Concern   Not on file  Social History Narrative   ** Merged History Encounter **       Social Determinants of Health   Financial Resource Strain: Not on file  Food Insecurity: Not on file  Transportation Needs: Not on file  Physical Activity: Not on file  Stress: Not on file  Social Connections: Not on file  Intimate Partner Violence: Not on file    FAMILY HISTORY: Family History  Problem Relation Age of Onset   Hypertension Mother    Obesity Father     ALLERGIES:  is allergic to hydrocodone-acetaminophen, vicodin [hydrocodone-acetaminophen], and codeine.  MEDICATIONS:  Current Outpatient Medications  Medication Sig Dispense Refill   allopurinol (ZYLOPRIM) 300 MG tablet Take 1 tablet by mouth daily.     amLODipine (NORVASC) 5 MG tablet      aspirin EC 81 MG tablet Take 81 mg by mouth at bedtime.     augmented betamethasone dipropionate (DIPROLENE-AF) 0.05 % cream      carvedilol (COREG) 3.125 MG tablet Take 3.125 mg by mouth 2 (two) times daily with a meal.      clotrimazole-betamethasone (LOTRISONE) cream      clotrimazole-betamethasone (LOTRISONE) cream Apply 1 Application topically daily. 30 g 0   Colchicine (MITIGARE) 0.6 MG CAPS Take 1 capsule by mouth daily. (Patient not taking: Reported on 05/05/2023) 7 capsule 0   colchicine 0.6 MG tablet Take 1 tablet (0.6 mg total) by mouth daily. (Patient not taking: Reported on 05/05/2023) 7 tablet 0   Continuous Blood Gluc Receiver (FREESTYLE LIBRE 2 READER SYSTM) DEVI 1 each by Does not apply route daily. As directed 1 Device 0   Continuous Blood Gluc Sensor (FREESTYLE LIBRE 14 DAY  SENSOR) MISC 1 each by Does not apply route daily. Apply to skin for 14 days then remove 2 each 0   COVID-19 mRNA Vac-TriS, Pfizer, (PFIZER-BIONT COVID-19 VAC-TRIS) SUSP injection Inject into the muscle. 0.3 mL 0   Cyanocobalamin (B-12 COMPLIANCE INJECTION IJ) B-12 Compliance     Cyanocobalamin (VITAMIN B 12) 500 MCG TABS 1 tablet     doxazosin (CARDURA) 4 MG tablet Take 4 mg by mouth at bedtime.     doxazosin (CARDURA) 4 MG tablet Take 1 tablet by mouth at bedtime.     doxycycline (VIBRA-TABS) 100 MG tablet Take 1 tablet (100 mg total) by mouth 2 (two) times daily. (Patient not taking: Reported on 05/05/2023) 20 tablet 0   DROPLET PEN NEEDLES 32G X 4 MM MISC      empagliflozin (JARDIANCE) 25 MG TABS tablet Take 25 mg by mouth daily.     ergocalciferol (VITAMIN D2) 1.25 MG (50000 UT) capsule ergocalciferol (vitamin D2) 1,250 mcg (50,000 unit) capsule     FLUZONE  HIGH-DOSE QUADRIVALENT 0.7 ML SUSY      gabapentin (NEURONTIN) 100 MG capsule 1 capsule     glipiZIDE (GLUCOTROL) 5 MG tablet Take 5 mg by mouth once.     latanoprost (XALATAN) 0.005 % ophthalmic solution      losartan-hydrochlorothiazide (HYZAAR) 100-25 MG tablet Take 1 tablet by mouth daily.      magnesium gluconate (MAGONATE) 500 MG tablet Take 500 mg by mouth 3 (three) times a week.     magnesium gluconate (MAGONATE) 500 MG tablet Take by mouth.     Methylcellulose, Laxative, 500 MG TABS Take 500 mg by mouth daily.     Multiple Vitamin (MULTIVITAMIN WITH MINERALS) TABS Take 1 tablet by mouth daily.     PFIZER COVID-19 VAC BIVALENT injection      pravastatin (PRAVACHOL) 40 MG tablet Take 40 mg by mouth at bedtime.     SHINGRIX injection      terbinafine (LAMISIL) 250 MG tablet Take 1 tablet (250 mg total) by mouth daily. 15 tablet 0   traMADol (ULTRAM) 50 MG tablet Ultram 50 mg tablet  Take 1 tablet 3 times a day by oral route as needed for pain for 5 days.     VICTOZA 18 MG/3ML SOPN Inject 0.6 mg into the skin daily.  (Patient not  taking: Reported on 05/05/2023)  0   vitamin B-12 (CYANOCOBALAMIN) 500 MCG tablet Take 500 mcg by mouth daily.     No current facility-administered medications for this visit.     PHYSICAL EXAMINATION: ECOG PERFORMANCE STATUS: 0 - Asymptomatic  Vitals:   05/29/23 1247  BP: (!) 144/71  Pulse: 76  Resp: 16  Temp: 97.7 F (36.5 C)  SpO2: 94%     Physical Exam Constitutional:      Appearance: Normal appearance.  Cardiovascular:     Rate and Rhythm: Normal rate and regular rhythm.     Pulses: Normal pulses.     Heart sounds: Normal heart sounds.  Pulmonary:     Effort: Pulmonary effort is normal.     Breath sounds: Normal breath sounds.  Abdominal:     General: Abdomen is flat. Bowel sounds are normal.     Palpations: Abdomen is soft.  Musculoskeletal:     Cervical back: Normal range of motion and neck supple. No rigidity.  Lymphadenopathy:     Cervical: No cervical adenopathy.  Neurological:     Mental Status: He is alert.      LABORATORY DATA:  I have reviewed the data as listed Lab Results  Component Value Date   WBC 4.8 05/29/2023   HGB 13.0 05/29/2023   HCT 41.2 05/29/2023   MCV 93.2 05/29/2023   PLT 132 (L) 05/29/2023     Chemistry      Component Value Date/Time   NA 138 11/08/2022 0926   NA 141 02/25/2021 1158   K 3.7 11/08/2022 0926   CL 104 11/08/2022 0926   CO2 24 11/08/2022 0926   BUN 42 (H) 11/08/2022 0926   BUN 42 (H) 02/25/2021 1158   CREATININE 2.08 (H) 11/08/2022 0926   GLU 105 02/06/2019 0000      Component Value Date/Time   CALCIUM 9.6 11/08/2022 0926   ALKPHOS 51 11/08/2022 0926   AST 19 11/08/2022 0926   ALT 16 11/08/2022 0926   BILITOT 0.7 11/08/2022 0926     I reviewed labs from outside.  His white blood cell count was 3700 hemoglobin of 13.3 and platelet count 106,000 He was noted  to have some monocytosis.  Creatinine noted to be 1.6.  Otherwise calcium, total protein and alk phos are normal.  TSH is normal hemoglobin A1c is  5.5, vitamin D of 31.4.  PSA of 0.25.  CBC in September 2020 also showed a platelet count of 111,000 Back in 2013, his white blood cell count was normal, his platelet count was 212,000 and his hemoglobin was normal.  Folate leves normal He has Hep C antibodies.  Undetectable hepatitis C viral RNA. CKD which is stable essentially B12 is normal Ferritin 284 Again, we dont have labs between 2013 and 2020 to compare and assess the chronicity of thrombocytopenia.  Labs from 12/09/2021 with a white blood cell count of 4.9, hemoglobin of 13.5 and platelet count of 131,000. Overall stable thrombocytopenia.  CBC from today reviewed,mild thrombocytopenia otherwise no evidence of leukopenia or anemia.  RADIOGRAPHIC STUDIES: I have personally reviewed the radiological images as listed and agreed with the findings in the report. No results found.  All questions were answered. The patient knows to call the clinic with any problems, questions or concerns. I spent 20 minutes in the care of this patient including H and P, review of records, counseling and coordination of care.     Rachel Moulds, MD 05/29/2023 12:52 PM

## 2023-05-31 DIAGNOSIS — H401131 Primary open-angle glaucoma, bilateral, mild stage: Secondary | ICD-10-CM | POA: Diagnosis not present

## 2023-06-26 DIAGNOSIS — R21 Rash and other nonspecific skin eruption: Secondary | ICD-10-CM | POA: Diagnosis not present

## 2023-06-26 DIAGNOSIS — L509 Urticaria, unspecified: Secondary | ICD-10-CM | POA: Diagnosis not present

## 2023-07-10 DIAGNOSIS — I10 Essential (primary) hypertension: Secondary | ICD-10-CM | POA: Diagnosis not present

## 2023-07-10 DIAGNOSIS — R635 Abnormal weight gain: Secondary | ICD-10-CM | POA: Diagnosis not present

## 2023-07-10 DIAGNOSIS — E1122 Type 2 diabetes mellitus with diabetic chronic kidney disease: Secondary | ICD-10-CM | POA: Diagnosis not present

## 2023-07-10 DIAGNOSIS — E7800 Pure hypercholesterolemia, unspecified: Secondary | ICD-10-CM | POA: Diagnosis not present

## 2023-07-20 DIAGNOSIS — M1990 Unspecified osteoarthritis, unspecified site: Secondary | ICD-10-CM | POA: Diagnosis not present

## 2023-07-20 DIAGNOSIS — E7800 Pure hypercholesterolemia, unspecified: Secondary | ICD-10-CM | POA: Diagnosis not present

## 2023-07-20 DIAGNOSIS — E1140 Type 2 diabetes mellitus with diabetic neuropathy, unspecified: Secondary | ICD-10-CM | POA: Diagnosis not present

## 2023-07-20 DIAGNOSIS — E1122 Type 2 diabetes mellitus with diabetic chronic kidney disease: Secondary | ICD-10-CM | POA: Diagnosis not present

## 2023-07-20 DIAGNOSIS — N1831 Chronic kidney disease, stage 3a: Secondary | ICD-10-CM | POA: Diagnosis not present

## 2023-07-20 DIAGNOSIS — I10 Essential (primary) hypertension: Secondary | ICD-10-CM | POA: Diagnosis not present

## 2023-07-20 DIAGNOSIS — M109 Gout, unspecified: Secondary | ICD-10-CM | POA: Diagnosis not present

## 2023-07-20 DIAGNOSIS — D61818 Other pancytopenia: Secondary | ICD-10-CM | POA: Diagnosis not present

## 2023-07-20 DIAGNOSIS — E559 Vitamin D deficiency, unspecified: Secondary | ICD-10-CM | POA: Diagnosis not present

## 2023-07-21 ENCOUNTER — Encounter (HOSPITAL_COMMUNITY): Payer: Self-pay | Admitting: Gastroenterology

## 2023-07-21 ENCOUNTER — Ambulatory Visit (HOSPITAL_COMMUNITY): Payer: Medicare HMO | Admitting: Certified Registered"

## 2023-07-21 ENCOUNTER — Other Ambulatory Visit: Payer: Self-pay

## 2023-07-21 ENCOUNTER — Encounter (HOSPITAL_COMMUNITY)
Admission: RE | Disposition: A | Discharge status: Home / Self Care | Payer: Self-pay | Source: Ambulatory Visit | Attending: Gastroenterology

## 2023-07-21 ENCOUNTER — Ambulatory Visit (HOSPITAL_COMMUNITY)
Admission: RE | Admit: 2023-07-21 | Discharge: 2023-07-21 | Disposition: A | Discharge status: Home / Self Care | Payer: Medicare HMO | Source: Ambulatory Visit | Attending: Gastroenterology | Admitting: Gastroenterology

## 2023-07-21 DIAGNOSIS — D123 Benign neoplasm of transverse colon: Secondary | ICD-10-CM | POA: Diagnosis not present

## 2023-07-21 DIAGNOSIS — Z1211 Encounter for screening for malignant neoplasm of colon: Secondary | ICD-10-CM | POA: Diagnosis not present

## 2023-07-21 DIAGNOSIS — Z139 Encounter for screening, unspecified: Secondary | ICD-10-CM | POA: Diagnosis not present

## 2023-07-21 DIAGNOSIS — M069 Rheumatoid arthritis, unspecified: Secondary | ICD-10-CM | POA: Insufficient documentation

## 2023-07-21 DIAGNOSIS — Z87891 Personal history of nicotine dependence: Secondary | ICD-10-CM | POA: Insufficient documentation

## 2023-07-21 DIAGNOSIS — E119 Type 2 diabetes mellitus without complications: Secondary | ICD-10-CM

## 2023-07-21 DIAGNOSIS — G4730 Sleep apnea, unspecified: Secondary | ICD-10-CM | POA: Diagnosis not present

## 2023-07-21 DIAGNOSIS — I10 Essential (primary) hypertension: Secondary | ICD-10-CM | POA: Diagnosis not present

## 2023-07-21 DIAGNOSIS — K635 Polyp of colon: Secondary | ICD-10-CM | POA: Diagnosis not present

## 2023-07-21 DIAGNOSIS — Z860100 Personal history of colon polyps, unspecified: Secondary | ICD-10-CM | POA: Diagnosis not present

## 2023-07-21 HISTORY — PX: POLYPECTOMY: SHX5525

## 2023-07-21 HISTORY — PX: COLONOSCOPY WITH PROPOFOL: SHX5780

## 2023-07-21 LAB — GLUCOSE, CAPILLARY: Glucose-Capillary: 98 mg/dL (ref 70–99)

## 2023-07-21 SURGERY — COLONOSCOPY WITH PROPOFOL
Anesthesia: Monitor Anesthesia Care

## 2023-07-21 MED ORDER — LIDOCAINE HCL (CARDIAC) PF 100 MG/5ML IV SOSY
PREFILLED_SYRINGE | INTRAVENOUS | Status: DC | PRN
  Administered 2023-07-21: 100 mg via INTRAVENOUS

## 2023-07-21 MED ORDER — GLYCOPYRROLATE 0.2 MG/ML IJ SOLN
INTRAMUSCULAR | Status: DC | PRN
  Administered 2023-07-21: .1 mg via INTRAVENOUS

## 2023-07-21 MED ORDER — PROPOFOL 500 MG/50ML IV EMUL
INTRAVENOUS | Status: DC | PRN
  Administered 2023-07-21: 125 ug/kg/min via INTRAVENOUS

## 2023-07-21 MED ORDER — SODIUM CHLORIDE 0.9 % IV SOLN: INTRAVENOUS | Status: DC | PRN

## 2023-07-21 MED ORDER — PROPOFOL 10 MG/ML IV BOLUS
INTRAVENOUS | Status: DC | PRN
  Administered 2023-07-21: 60 mg via INTRAVENOUS

## 2023-07-21 MED ORDER — PROPOFOL 1000 MG/100ML IV EMUL
INTRAVENOUS | Status: AC
  Filled 2023-07-21: qty 100

## 2023-07-21 MED ORDER — PROPOFOL 10 MG/ML IV BOLUS
INTRAVENOUS | Status: AC
  Filled 2023-07-21: qty 20

## 2023-07-21 SURGICAL SUPPLY — 21 items
ELECT REM PT RETURN 9FT ADLT (ELECTROSURGICAL)
ELECTRODE REM PT RTRN 9FT ADLT (ELECTROSURGICAL) IMPLANT
FCP BXJMBJMB 240X2.8X (CUTTING FORCEPS)
FLOOR PAD 36X40 (MISCELLANEOUS) ×2
FORCEPS BIOP RAD 4 LRG CAP 4 (CUTTING FORCEPS) IMPLANT
FORCEPS BIOP RJ4 240 W/NDL (CUTTING FORCEPS)
FORCEPS BXJMBJMB 240X2.8X (CUTTING FORCEPS) IMPLANT
INJECTOR/SNARE I SNARE (MISCELLANEOUS) IMPLANT
LUBRICANT JELLY 4.5OZ STERILE (MISCELLANEOUS) IMPLANT
MANIFOLD NEPTUNE II (INSTRUMENTS) IMPLANT
NDL SCLEROTHERAPY 25GX240 (NEEDLE) IMPLANT
NEEDLE SCLEROTHERAPY 25GX240 (NEEDLE) IMPLANT
PAD FLOOR 36X40 (MISCELLANEOUS) ×2 IMPLANT
PROBE APC STR FIRE (PROBE) IMPLANT
PROBE INJECTION GOLD 7FR (MISCELLANEOUS) IMPLANT
SNARE ROTATE MED OVAL 20MM (MISCELLANEOUS) IMPLANT
SYR 50ML LL SCALE MARK (SYRINGE) IMPLANT
TRAP SPECIMEN MUCOUS 40CC (MISCELLANEOUS) IMPLANT
TUBING ENDO SMARTCAP PENTAX (MISCELLANEOUS) IMPLANT
TUBING IRRIGATION ENDOGATOR (MISCELLANEOUS) ×2 IMPLANT
WATER STERILE IRR 1000ML POUR (IV SOLUTION) IMPLANT

## 2023-07-21 NOTE — Anesthesia Postprocedure Evaluation (Signed)
Anesthesia Post Note  Patient: Lance Gregory.  Procedure(s) Performed: COLONOSCOPY WITH PROPOFOL POLYPECTOMY     Patient location during evaluation: Endoscopy Anesthesia Type: MAC Level of consciousness: awake Pain management: pain level controlled Vital Signs Assessment: post-procedure vital signs reviewed and stable Respiratory status: spontaneous breathing, nonlabored ventilation and respiratory function stable Cardiovascular status: blood pressure returned to baseline and stable Postop Assessment: no apparent nausea or vomiting Anesthetic complications: no   No notable events documented.  Last Vitals:  Vitals:   07/21/23 1040 07/21/23 1050  BP: (!) 145/58 (!) 157/79  Pulse: 77 77  Resp: (!) 22 15  Temp:    SpO2: 92% 98%    Last Pain:  Vitals:   07/21/23 1050  TempSrc:   PainSc: 0-No pain                 [name]

## 2023-07-21 NOTE — Transfer of Care (Signed)
Immediate Anesthesia Transfer of Care Note  Patient: Lance Lew Sr.  Procedure(s) Performed: COLONOSCOPY WITH PROPOFOL POLYPECTOMY  Patient Location: Endoscopy Unit  Anesthesia Type:MAC  Level of Consciousness: drowsy and patient cooperative  Airway & Oxygen Therapy: Patient Spontanous Breathing  Post-op Assessment: Report given to RN and Post -op Vital signs reviewed and stable  Post vital signs: Reviewed and stable  Last Vitals:  Vitals Value Taken Time  BP 116/58 07/21/23 1034  Temp 36.6 C 07/21/23 1034  Pulse 82 07/21/23 1037  Resp 22 07/21/23 1037  SpO2 92 % 07/21/23 1037  Vitals shown include unfiled device data.  Last Pain:  Vitals:   07/21/23 1034  TempSrc: Tympanic         Complications: No notable events documented.

## 2023-07-21 NOTE — Discharge Instructions (Signed)

## 2023-07-21 NOTE — H&P (Signed)
Lance Lew Sr. HPI: At this time the patient denies any problems with nausea, vomiting, fevers, chills, abdominal pain, diarrhea, constipation, hematochezia, melena, GERD, or dysphagia. The patient denies any known family history of colon cancers. No complaints of chest pain, SOB, MI, or sleep apnea.  On 03/02/2018 his colonoscopy was positive for two small adenomas.  His last A1C was at 6% range.  Past Medical History:  Diagnosis Date   Arthritis    Back pain    Diabetes mellitus without complication (HCC)    ORAL MEDS-NO INSULIN   GERD (gastroesophageal reflux disease)    OCCAS AFTER EATING CERTAIN FOODS   Hyperlipidemia    Hypertension    Joint pain    Rheumatoid arthritis (HCC)    Shortness of breath    Sleep apnea    Ureteral stone    LEFT  --DISCOMFORT    Past Surgical History:  Procedure Laterality Date   ANAL FISSURE REPAIR     COLONOSCOPY WITH PROPOFOL N/A 03/02/2018   Procedure: COLONOSCOPY WITH PROPOFOL;  Surgeon: Jeani Hawking, MD;  [facility]: [facility];  Service: Endoscopy;  Laterality: N/A;   CYSTOSCOPY WITH URETEROSCOPY  05/16/2012   Procedure: CYSTOSCOPY WITH URETEROSCOPY;  Surgeon: Milford Cage, MD;  Location: [facility];  Service: Urology;  Laterality: Left;  left ureteral stent placement   HOLMIUM LASER APPLICATION  05/16/2012   Procedure: HOLMIUM LASER APPLICATION;  Surgeon: Milford Cage, MD;  Location: [facility];  Service: Urology;  Laterality: Left;   KNEE ARTHROSCOPY     SEVERAL  SURGERIES BOTH KNEES   POLYPECTOMY  03/02/2018   Procedure: POLYPECTOMY;  Surgeon: Jeani Hawking, MD;  [facility]: [facility];  Service: Endoscopy;;   SUBMUCOSAL INJECTION  03/02/2018   Procedure: SUBMUCOSAL INJECTION saline;  Surgeon: Jeani Hawking, MD;  [facility];  Service: Endoscopy;;   VASCECTOMY      Family History  Problem Relation Age of Onset   Hypertension Mother    Obesity Father     Social History:  reports that he has quit smoking. He  has never used smokeless tobacco. He reports that he does not currently use alcohol. He reports that he does not use drugs.  Allergies:  Allergies  Allergen Reactions   Hydrocodone-Acetaminophen Swelling    Remembers he didn't like   Vicodin [Hydrocodone-Acetaminophen] Other (See Comments)    Remembers he didn't like   Codeine Other (See Comments)    REACTION WAS YEARS AGO-PT DOES NOT REMEMBER WHAT THE REACTION WAS    Medications: Scheduled: Continuous:  Results for orders placed or performed during the hospital encounter of 07/21/23 (from the past 24 hours)  Glucose, capillary     Status: None   Collection Time: 07/21/23  9:39 AM  Result Value Ref Range   Glucose-Capillary 98 70 - 99 mg/dL     No results found.  ROS:  As stated above in the HPI otherwise negative.  Blood pressure (!) 168/76, pulse 65, temperature 98.2 F (36.8 C), temperature source Temporal, resp. rate 12, height 5' 7.5" (1.715 m), weight (!) 145.2 kg, SpO2 96%.    PE: Gen: NAD, Alert and Oriented HEENT:  [place]/AT, EOMI Neck: Supple, no LAD Lungs: CTA Bilaterally CV: RRR without M/G/R ABD: Soft, NTND, +BS Ext: No C/C/E  Assessment/Plan: 1) Screening colonoscopy.  Mika Anastasi D 07/21/2023, 9:51 AM

## 2023-07-21 NOTE — Anesthesia Preprocedure Evaluation (Signed)
Anesthesia Evaluation  Patient identified by MRN, date of birth, ID band Patient awake    Reviewed: Allergy & Precautions, NPO status , Patient's Chart, lab work & pertinent test results  Airway Mallampati: III  TM Distance: >3 FB Neck ROM: Full    Dental no notable dental hx.    Pulmonary sleep apnea , former smoker   Pulmonary exam normal        Cardiovascular hypertension, Pt. on medications and Pt. on home beta blockers Normal cardiovascular exam     Neuro/Psych negative neurological ROS  negative psych ROS   GI/Hepatic negative GI ROS, Neg liver ROS, Bowel prep,,,  Endo/Other  diabetes, Oral Hypoglycemic Agents    Renal/GU negative Renal ROS     Musculoskeletal  (+) Arthritis ,    Abdominal  (+) + obese  Peds  Hematology negative hematology ROS (+)   Anesthesia Other Findings screening  Reproductive/Obstetrics                             Anesthesia Physical Anesthesia Plan  ASA: 3  Anesthesia Plan: MAC   Post-op Pain Management:    Induction:   PONV Risk Score and Plan: 1 and Propofol infusion and Treatment may vary due to age or medical condition  Airway Management Planned: Simple Face Mask  Additional Equipment:   Intra-op Plan:   Post-operative Plan:   Informed Consent: I have reviewed the patients History and Physical, chart, labs and discussed the procedure including the risks, benefits and alternatives for the proposed anesthesia with the patient or authorized representative who has indicated his/her understanding and acceptance.     Dental advisory given  Plan Discussed with: CRNA  Anesthesia Plan Comments:        Anesthesia Quick Evaluation

## 2023-07-21 NOTE — Op Note (Signed)
[facility] Patient Name: Lance Gregory Procedure Date: 07/21/2023 MRN: [id number] Attending MD: Jeani Hawking , MD, [id number] Date of Birth: May 25, 1951 CSN: [id number] Age: 73 Admit Type: Outpatient Procedure:                Colonoscopy Indications:              Screening for colorectal malignant neoplasm Providers:                Jeani Hawking, MD, Suzy Bouchard, RN, Rhodia Albright,                            Technician, Alan Ripper, Technician Referring MD:              Medicines:                 Complications:            No immediate complications. Estimated Blood Loss:     Estimated blood loss: none. Procedure:                Pre-Anesthesia Assessment:                           - Prior to the procedure, a History and Physical                            was performed, and patient medications and                            allergies were reviewed. The patient's tolerance of                            previous anesthesia was also reviewed. The risks                            and benefits of the procedure and the sedation                            options and risks were discussed with the patient.                            All questions were answered, and informed consent                            was obtained. Prior Anticoagulants: The patient has                            taken no anticoagulant or antiplatelet agents. ASA                            Grade Assessment: III - A patient with severe                            systemic disease. After reviewing the risks and  benefits, the patient was deemed in satisfactory                            condition to undergo the procedure.                           - Sedation was administered by an anesthesia                            professional. Deep sedation was attained.                           After obtaining informed consent, the colonoscope                            was passed under direct  vision. Throughout the                            procedure, the patient's blood pressure, pulse, and                            oxygen saturations were monitored continuously. The                            CF-HQ190L ([id number]) Olympus colonoscope was                            introduced through the anus and advanced to the the                            cecum, identified by appendiceal orifice and                            ileocecal valve. The colonoscopy was performed                            without difficulty. The patient tolerated the                            procedure well. The quality of the bowel                            preparation was evaluated using the BBPS [facility]                            Bowel Preparation Scale) with scores of: Right                            Colon = 3 (entire mucosa seen well with no residual                            staining, small fragments of stool or opaque  liquid), Transverse Colon = 2 (minor amount of                            residual staining, small fragments of stool and/or                            opaque liquid, but mucosa seen well) and Left Colon                            = 3 (entire mucosa seen well with no residual                            staining, small fragments of stool or opaque                            liquid). The total BBPS score equals 8. The quality                            of the bowel preparation was good. The ileocecal                            valve, appendiceal orifice, and rectum were                            photographed. Scope In: 10:05:15 AM Scope Out: 10:26:18 AM Scope Withdrawal Time: 0 hours 17 minutes 51 seconds  Total Procedure Duration: 0 hours 21 minutes 3 seconds  Findings:      A 10 mm polyp was found in the transverse colon. The polyp was       semi-sessile. The polyp was removed with a hot snare. Resection and       retrieval were complete.      A 3 mm polyp  was found in the transverse colon. The polyp was sessile.       The polyp was removed with a cold snare. Resection and retrieval were       complete. Impression:               - One 10 mm polyp in the transverse colon, removed                            with a hot snare. Resected and retrieved.                           - One 3 mm polyp in the transverse colon, removed                            with a cold snare. Resected and retrieved. Moderate Sedation:      Not Applicable - Patient had care per Anesthesia. Recommendation:           - Patient has a contact number available for                            emergencies. The signs and symptoms of potential  delayed complications were discussed with the                            patient. Return to normal activities tomorrow.                            Written discharge instructions were provided to the                            patient.                           - Resume previous diet.                           - Continue present medications.                           - Await pathology results.                           - Repeat colonoscopy in 3 years for surveillance. Procedure Code(s):        --- Professional ---                           [id number], Colonoscopy, flexible; with removal of                            tumor(s), polyp(s), or other lesion(s) by snare                            technique Diagnosis Code(s):        --- Professional ---                           Z12.11, Encounter for screening for malignant                            neoplasm of colon                           D12.3, Benign neoplasm of transverse colon (hepatic                            flexure or splenic flexure) CPT copyright 2022 [employer]. All rights reserved. The codes documented in this report are preliminary and upon coder review may  be revised to meet current compliance requirements. Jeani Hawking, MD Jeani Hawking, MD 07/21/2023 10:31:57 AM This report has been signed electronically. Number of Addenda: 0

## 2023-07-23 ENCOUNTER — Encounter (HOSPITAL_COMMUNITY): Payer: Self-pay | Admitting: Gastroenterology

## 2023-07-24 LAB — SURGICAL PATHOLOGY

## 2023-08-22 DIAGNOSIS — L280 Lichen simplex chronicus: Secondary | ICD-10-CM | POA: Diagnosis not present

## 2023-08-22 DIAGNOSIS — L308 Other specified dermatitis: Secondary | ICD-10-CM | POA: Diagnosis not present

## 2023-09-25 DIAGNOSIS — N1832 Chronic kidney disease, stage 3b: Secondary | ICD-10-CM | POA: Diagnosis not present

## 2023-10-02 DIAGNOSIS — E119 Type 2 diabetes mellitus without complications: Secondary | ICD-10-CM | POA: Diagnosis not present

## 2023-10-04 DIAGNOSIS — Z87442 Personal history of urinary calculi: Secondary | ICD-10-CM | POA: Diagnosis not present

## 2023-10-04 DIAGNOSIS — D631 Anemia in chronic kidney disease: Secondary | ICD-10-CM | POA: Diagnosis not present

## 2023-10-04 DIAGNOSIS — N1832 Chronic kidney disease, stage 3b: Secondary | ICD-10-CM | POA: Diagnosis not present

## 2023-10-04 DIAGNOSIS — E559 Vitamin D deficiency, unspecified: Secondary | ICD-10-CM | POA: Diagnosis not present

## 2023-10-04 DIAGNOSIS — I129 Hypertensive chronic kidney disease with stage 1 through stage 4 chronic kidney disease, or unspecified chronic kidney disease: Secondary | ICD-10-CM | POA: Diagnosis not present

## 2023-10-04 DIAGNOSIS — R600 Localized edema: Secondary | ICD-10-CM | POA: Diagnosis not present

## 2023-10-04 DIAGNOSIS — N400 Enlarged prostate without lower urinary tract symptoms: Secondary | ICD-10-CM | POA: Diagnosis not present

## 2023-10-12 DIAGNOSIS — E1122 Type 2 diabetes mellitus with diabetic chronic kidney disease: Secondary | ICD-10-CM | POA: Diagnosis not present

## 2023-10-12 DIAGNOSIS — M109 Gout, unspecified: Secondary | ICD-10-CM | POA: Diagnosis not present

## 2023-10-12 DIAGNOSIS — E7800 Pure hypercholesterolemia, unspecified: Secondary | ICD-10-CM | POA: Diagnosis not present

## 2023-10-12 DIAGNOSIS — E559 Vitamin D deficiency, unspecified: Secondary | ICD-10-CM | POA: Diagnosis not present

## 2023-10-12 DIAGNOSIS — D61818 Other pancytopenia: Secondary | ICD-10-CM | POA: Diagnosis not present

## 2023-10-19 DIAGNOSIS — E1140 Type 2 diabetes mellitus with diabetic neuropathy, unspecified: Secondary | ICD-10-CM | POA: Diagnosis not present

## 2023-10-19 DIAGNOSIS — M109 Gout, unspecified: Secondary | ICD-10-CM | POA: Diagnosis not present

## 2023-10-19 DIAGNOSIS — E559 Vitamin D deficiency, unspecified: Secondary | ICD-10-CM | POA: Diagnosis not present

## 2023-10-19 DIAGNOSIS — E7800 Pure hypercholesterolemia, unspecified: Secondary | ICD-10-CM | POA: Diagnosis not present

## 2023-10-19 DIAGNOSIS — M545 Low back pain, unspecified: Secondary | ICD-10-CM | POA: Diagnosis not present

## 2023-10-19 DIAGNOSIS — E1122 Type 2 diabetes mellitus with diabetic chronic kidney disease: Secondary | ICD-10-CM | POA: Diagnosis not present

## 2023-10-19 DIAGNOSIS — D61818 Other pancytopenia: Secondary | ICD-10-CM | POA: Diagnosis not present

## 2023-10-19 DIAGNOSIS — I10 Essential (primary) hypertension: Secondary | ICD-10-CM | POA: Diagnosis not present

## 2023-10-19 DIAGNOSIS — Z0000 Encounter for general adult medical examination without abnormal findings: Secondary | ICD-10-CM | POA: Diagnosis not present

## 2023-10-27 ENCOUNTER — Other Ambulatory Visit: Payer: Self-pay | Admitting: Orthopedic Surgery

## 2023-10-27 DIAGNOSIS — M545 Low back pain, unspecified: Secondary | ICD-10-CM | POA: Diagnosis not present

## 2023-10-27 DIAGNOSIS — M5416 Radiculopathy, lumbar region: Secondary | ICD-10-CM | POA: Diagnosis not present

## 2023-11-11 ENCOUNTER — Other Ambulatory Visit

## 2023-11-12 ENCOUNTER — Ambulatory Visit
Admission: RE | Admit: 2023-11-12 | Discharge: 2023-11-12 | Disposition: A | Discharge status: Home / Self Care | Source: Ambulatory Visit | Attending: Orthopedic Surgery | Admitting: Orthopedic Surgery

## 2023-11-12 DIAGNOSIS — M545 Low back pain, unspecified: Secondary | ICD-10-CM

## 2023-11-12 DIAGNOSIS — M48061 Spinal stenosis, lumbar region without neurogenic claudication: Secondary | ICD-10-CM | POA: Diagnosis not present

## 2023-11-20 DIAGNOSIS — M4696 Unspecified inflammatory spondylopathy, lumbar region: Secondary | ICD-10-CM | POA: Diagnosis not present

## 2023-12-08 DIAGNOSIS — M47816 Spondylosis without myelopathy or radiculopathy, lumbar region: Secondary | ICD-10-CM | POA: Diagnosis not present

## 2023-12-28 DIAGNOSIS — M47816 Spondylosis without myelopathy or radiculopathy, lumbar region: Secondary | ICD-10-CM | POA: Diagnosis not present

## 2023-12-28 DIAGNOSIS — M545 Low back pain, unspecified: Secondary | ICD-10-CM | POA: Diagnosis not present

## 2024-01-11 DIAGNOSIS — M47816 Spondylosis without myelopathy or radiculopathy, lumbar region: Secondary | ICD-10-CM | POA: Diagnosis not present

## 2024-01-11 DIAGNOSIS — M47817 Spondylosis without myelopathy or radiculopathy, lumbosacral region: Secondary | ICD-10-CM | POA: Diagnosis not present

## 2024-01-11 DIAGNOSIS — M545 Low back pain, unspecified: Secondary | ICD-10-CM | POA: Diagnosis not present

## 2024-01-30 DIAGNOSIS — N1832 Chronic kidney disease, stage 3b: Secondary | ICD-10-CM | POA: Diagnosis not present

## 2024-02-08 DIAGNOSIS — H401131 Primary open-angle glaucoma, bilateral, mild stage: Secondary | ICD-10-CM | POA: Diagnosis not present

## 2024-03-25 DIAGNOSIS — R600 Localized edema: Secondary | ICD-10-CM | POA: Diagnosis not present

## 2024-03-25 DIAGNOSIS — I129 Hypertensive chronic kidney disease with stage 1 through stage 4 chronic kidney disease, or unspecified chronic kidney disease: Secondary | ICD-10-CM | POA: Diagnosis not present

## 2024-03-25 DIAGNOSIS — N400 Enlarged prostate without lower urinary tract symptoms: Secondary | ICD-10-CM | POA: Diagnosis not present

## 2024-03-25 DIAGNOSIS — N1832 Chronic kidney disease, stage 3b: Secondary | ICD-10-CM | POA: Diagnosis not present

## 2024-03-25 DIAGNOSIS — E559 Vitamin D deficiency, unspecified: Secondary | ICD-10-CM | POA: Diagnosis not present

## 2024-03-25 DIAGNOSIS — D631 Anemia in chronic kidney disease: Secondary | ICD-10-CM | POA: Diagnosis not present

## 2024-03-25 DIAGNOSIS — Z87442 Personal history of urinary calculi: Secondary | ICD-10-CM | POA: Diagnosis not present

## 2024-04-11 DIAGNOSIS — I10 Essential (primary) hypertension: Secondary | ICD-10-CM | POA: Diagnosis not present

## 2024-04-11 DIAGNOSIS — E1122 Type 2 diabetes mellitus with diabetic chronic kidney disease: Secondary | ICD-10-CM | POA: Diagnosis not present

## 2024-04-11 DIAGNOSIS — E7800 Pure hypercholesterolemia, unspecified: Secondary | ICD-10-CM | POA: Diagnosis not present

## 2024-04-18 DIAGNOSIS — E782 Mixed hyperlipidemia: Secondary | ICD-10-CM | POA: Diagnosis not present

## 2024-04-18 DIAGNOSIS — E1140 Type 2 diabetes mellitus with diabetic neuropathy, unspecified: Secondary | ICD-10-CM | POA: Diagnosis not present

## 2024-04-18 DIAGNOSIS — N183 Chronic kidney disease, stage 3 unspecified: Secondary | ICD-10-CM | POA: Diagnosis not present

## 2024-04-18 DIAGNOSIS — E1122 Type 2 diabetes mellitus with diabetic chronic kidney disease: Secondary | ICD-10-CM | POA: Diagnosis not present

## 2024-04-18 DIAGNOSIS — N390 Urinary tract infection, site not specified: Secondary | ICD-10-CM | POA: Diagnosis not present

## 2024-04-18 DIAGNOSIS — I10 Essential (primary) hypertension: Secondary | ICD-10-CM | POA: Diagnosis not present

## 2024-04-18 DIAGNOSIS — M109 Gout, unspecified: Secondary | ICD-10-CM | POA: Diagnosis not present

## 2024-04-18 DIAGNOSIS — D61818 Other pancytopenia: Secondary | ICD-10-CM | POA: Diagnosis not present

## 2024-04-25 DIAGNOSIS — M25561 Pain in right knee: Secondary | ICD-10-CM | POA: Diagnosis not present

## 2024-04-25 DIAGNOSIS — M25562 Pain in left knee: Secondary | ICD-10-CM | POA: Diagnosis not present

## 2024-04-25 DIAGNOSIS — M5459 Other low back pain: Secondary | ICD-10-CM | POA: Diagnosis not present

## 2024-05-01 DIAGNOSIS — M5459 Other low back pain: Secondary | ICD-10-CM | POA: Diagnosis not present

## 2024-05-01 DIAGNOSIS — M25561 Pain in right knee: Secondary | ICD-10-CM | POA: Diagnosis not present

## 2024-05-01 DIAGNOSIS — M25562 Pain in left knee: Secondary | ICD-10-CM | POA: Diagnosis not present

## 2024-05-28 ENCOUNTER — Other Ambulatory Visit: Payer: Self-pay

## 2024-05-28 DIAGNOSIS — D61818 Other pancytopenia: Secondary | ICD-10-CM

## 2024-05-29 ENCOUNTER — Inpatient Hospital Stay: Payer: Medicare HMO

## 2024-05-29 ENCOUNTER — Inpatient Hospital Stay: Payer: Medicare HMO | Admitting: Hematology and Oncology
